# Patient Record
Sex: Male | Born: 1965 | Race: White | Hispanic: No | Marital: Married | Smoking: Never smoker
Health system: Southern US, Community
[De-identification: ages and names within clinical notes are randomized; demographics above are authoritative.]

## PROBLEM LIST (undated history)

## (undated) DIAGNOSIS — E669 Obesity, unspecified: Secondary | ICD-10-CM

## (undated) DIAGNOSIS — K649 Unspecified hemorrhoids: Secondary | ICD-10-CM

## (undated) DIAGNOSIS — R9431 Abnormal electrocardiogram [ECG] [EKG]: Secondary | ICD-10-CM

## (undated) DIAGNOSIS — I10 Essential (primary) hypertension: Secondary | ICD-10-CM

## (undated) DIAGNOSIS — R011 Cardiac murmur, unspecified: Secondary | ICD-10-CM

## (undated) DIAGNOSIS — G5600 Carpal tunnel syndrome, unspecified upper limb: Secondary | ICD-10-CM

## (undated) DIAGNOSIS — F32A Depression, unspecified: Secondary | ICD-10-CM

## (undated) DIAGNOSIS — F329 Major depressive disorder, single episode, unspecified: Secondary | ICD-10-CM

## (undated) DIAGNOSIS — R079 Chest pain, unspecified: Secondary | ICD-10-CM

## (undated) HISTORY — DX: Major depressive disorder, single episode, unspecified: F32.9

## (undated) HISTORY — DX: Abnormal electrocardiogram (ECG) (EKG): R94.31

## (undated) HISTORY — DX: Obesity, unspecified: E66.9

## (undated) HISTORY — PX: MELANOMA EXCISION: SHX5266

## (undated) HISTORY — DX: Carpal tunnel syndrome, unspecified upper limb: G56.00

## (undated) HISTORY — DX: Unspecified hemorrhoids: K64.9

## (undated) HISTORY — DX: Essential (primary) hypertension: I10

## (undated) HISTORY — DX: Depression, unspecified: F32.A

## (undated) HISTORY — DX: Chest pain, unspecified: R07.9

---

## 1998-07-18 ENCOUNTER — Ambulatory Visit: Admission: RE | Admit: 1998-07-18 | Discharge: 1998-07-18 | Payer: Self-pay | Admitting: Internal Medicine

## 2013-07-18 ENCOUNTER — Emergency Department (HOSPITAL_COMMUNITY): Payer: BC Managed Care – PPO

## 2013-07-18 ENCOUNTER — Encounter (HOSPITAL_COMMUNITY): Payer: Self-pay | Admitting: Emergency Medicine

## 2013-07-18 ENCOUNTER — Observation Stay (HOSPITAL_COMMUNITY)
Admission: EM | Admit: 2013-07-18 | Discharge: 2013-07-18 | Disposition: A | Discharge status: Home / Self Care | Payer: BC Managed Care – PPO | Attending: Internal Medicine | Admitting: Internal Medicine

## 2013-07-18 ENCOUNTER — Other Ambulatory Visit: Payer: Self-pay

## 2013-07-18 DIAGNOSIS — I10 Essential (primary) hypertension: Secondary | ICD-10-CM

## 2013-07-18 DIAGNOSIS — R011 Cardiac murmur, unspecified: Secondary | ICD-10-CM | POA: Diagnosis present

## 2013-07-18 DIAGNOSIS — R079 Chest pain, unspecified: Principal | ICD-10-CM | POA: Diagnosis present

## 2013-07-18 DIAGNOSIS — R9431 Abnormal electrocardiogram [ECG] [EKG]: Secondary | ICD-10-CM | POA: Diagnosis present

## 2013-07-18 HISTORY — DX: Cardiac murmur, unspecified: R01.1

## 2013-07-18 LAB — HEPATIC FUNCTION PANEL
ALT: 26 U/L (ref 0–53)
Alkaline Phosphatase: 48 U/L (ref 39–117)
Bilirubin, Direct: <0.1 mg/dL (ref 0.0–0.3)
Total Protein: 7.4 g/dL (ref 6.0–8.3)

## 2013-07-18 LAB — TSH: TSH: 1.441 u[IU]/mL (ref 0.350–4.500)

## 2013-07-18 LAB — CBC
MCH: 28.8 pg (ref 26.0–34.0)
MCHC: 33.7 g/dL (ref 30.0–36.0)
RDW: 13.2 % (ref 11.5–15.5)

## 2013-07-18 LAB — POCT I-STAT TROPONIN I: Troponin i, poc: 0 ng/mL (ref 0.00–0.08)

## 2013-07-18 LAB — BASIC METABOLIC PANEL
BUN: 12 mg/dL (ref 6–23)
Calcium: 9.2 mg/dL (ref 8.4–10.5)
GFR calc Af Amer: >90 mL/min (ref >90)
GFR calc non Af Amer: 86 mL/min — ABNORMAL LOW (ref >90)
Glucose, Bld: 108 mg/dL — ABNORMAL HIGH (ref 70–99)
Potassium: 4.6 mEq/L (ref 3.5–5.1)
Sodium: 139 mEq/L (ref 135–145)

## 2013-07-18 LAB — HEMOGLOBIN A1C: Mean Plasma Glucose: 114 mg/dL (ref <117)

## 2013-07-18 LAB — RAPID URINE DRUG SCREEN, HOSP PERFORMED
Barbiturates: NOT DETECTED
Cocaine: NOT DETECTED
Tetrahydrocannabinol: NOT DETECTED

## 2013-07-18 LAB — LIPID PANEL
Cholesterol: 128 mg/dL (ref 0–200)
Total CHOL/HDL Ratio: 3 RATIO
Triglycerides: 45 mg/dL (ref <150)
VLDL: 9 mg/dL (ref 0–40)

## 2013-07-18 LAB — CK TOTAL AND CKMB (NOT AT ARMC): Total CK: 71 U/L (ref 7–232)

## 2013-07-18 LAB — MRSA PCR SCREENING: MRSA by PCR: NEGATIVE

## 2013-07-18 MED ORDER — ASPIRIN EC 81 MG PO TBEC
81.0000 mg | DELAYED_RELEASE_TABLET | Freq: Every day | ORAL | Status: DC
  Administered 2013-07-18: 81 mg via ORAL
  Filled 2013-07-18: qty 1

## 2013-07-18 MED ORDER — NITROGLYCERIN 0.4 MG SL SUBL: 0.4000 mg | SUBLINGUAL_TABLET | SUBLINGUAL | Status: DC | PRN

## 2013-07-18 MED ORDER — ENOXAPARIN SODIUM 40 MG/0.4ML ~~LOC~~ SOLN
40.0000 mg | SUBCUTANEOUS | Status: DC
  Administered 2013-07-18: 40 mg via SUBCUTANEOUS
  Filled 2013-07-18: qty 0.4

## 2013-07-18 MED ORDER — ACETAMINOPHEN 325 MG PO TABS: 650.0000 mg | ORAL_TABLET | ORAL | Status: DC | PRN

## 2013-07-18 MED ORDER — NITROGLYCERIN 2 % TD OINT
1.0000 [in_us] | TOPICAL_OINTMENT | Freq: Once | TRANSDERMAL | Status: AC
  Administered 2013-07-18: 1 [in_us] via TOPICAL
  Filled 2013-07-18: qty 1

## 2013-07-18 MED ORDER — ONDANSETRON HCL 4 MG/2ML IJ SOLN: 4.0000 mg | Freq: Four times a day (QID) | INTRAMUSCULAR | Status: DC | PRN

## 2013-07-18 MED ORDER — NITROGLYCERIN 2 % TD OINT
0.5000 [in_us] | TOPICAL_OINTMENT | Freq: Four times a day (QID) | TRANSDERMAL | Status: DC
Start: 2013-07-18 — End: 2013-07-18
  Administered 2013-07-18 (×2): 0.5 [in_us] via TOPICAL
  Filled 2013-07-18: qty 30

## 2013-07-18 MED ORDER — ONDANSETRON HCL 4 MG/2ML IJ SOLN
4.0000 mg | Freq: Once | INTRAMUSCULAR | Status: AC
  Administered 2013-07-18: 4 mg via INTRAVENOUS
  Filled 2013-07-18: qty 2

## 2013-07-18 MED ORDER — PANTOPRAZOLE SODIUM 40 MG PO TBEC
40.0000 mg | DELAYED_RELEASE_TABLET | Freq: Every day | ORAL | Status: DC
  Administered 2013-07-18: 40 mg via ORAL
  Filled 2013-07-18: qty 1

## 2013-07-18 MED ORDER — MORPHINE SULFATE 2 MG/ML IJ SOLN: 1.0000 mg | INTRAMUSCULAR | Status: DC | PRN

## 2013-07-18 MED ORDER — GI COCKTAIL ~~LOC~~
30.0000 mL | Freq: Four times a day (QID) | ORAL | Status: DC | PRN
  Administered 2013-07-18: 30 mL via ORAL
  Filled 2013-07-18: qty 30

## 2013-07-18 MED ORDER — MORPHINE SULFATE 4 MG/ML IJ SOLN
4.0000 mg | Freq: Once | INTRAMUSCULAR | Status: AC
  Administered 2013-07-18: 4 mg via INTRAVENOUS
  Filled 2013-07-18: qty 1

## 2013-07-18 NOTE — Progress Notes (Signed)
UR Completed.  Haron Beilke Jane [phone] 07/18/2013  

## 2013-07-18 NOTE — H&P (Signed)
[facility] History and Physical  Patient: Jerry Parrish  ZOX:[id number]  DOB: [birth year]  DOA: 07/18/2013  Referring physician: Dr Arnoldo Morale PCP: No PCP Per Patient  Consults:   none  Chief Complaint: Chest pain  HPI: Jerry Parrish is a 47 y.o. male with Past medical history of cardiac murmur. He presented today with the complaint of chest pain that has been ongoing since last one week. He had 3 episodes of chest pain almost all of them occurred the night. The pain is located in the lower chest area feels like pressure, does not get worse with movement or breathing or cough. Is not associated with nausea vomiting diaphoresis or shortness of breath. Does not radiate. Tonight the pain was significantly severe and still continues with onset around 10:30 PM. Patient denies any complaint of fever, chills, cough, palpitation, dizziness, lightheadedness, syncopal episode, trauma. He denies any active smoking. He denies any premature cardiac disease in the family. He has felt some heartburn today but otherwise denies any history of GERD.   Review of Systems: as mentioned in the history of present illness.  A Comprehensive review of the other systems is negative.  Past Medical History  Diagnosis Date  . Heart murmur    History reviewed. No pertinent past surgical history. Social History:  reports that he has never smoked. He does not have any smokeless tobacco history on file. He reports that  drinks alcohol. He reports that he does not use illicit drugs. Patient is coming from home. Independent for most of his  ADL.  No Known Allergies  No family history on file.  Prior to Admission medications   Medication Sig Start Date End Date Taking? Authorizing Provider  aspirin 325 MG tablet Take 650 mg by mouth daily.   Yes Historical Provider, MD  Calcium Carbonate Antacid (TUMS PO) Take 2 tablets by mouth daily as needed (upset stomach, acid reflux).   Yes Historical Provider, MD     Physical Exam: Filed Vitals:   07/18/13 0044 07/18/13 0145 07/18/13 0400  BP: 159/89 142/74 138/83  Pulse: 67 56 56  Temp: 98.1 F (36.7 C)  98.1 F (36.7 C)  TempSrc: Oral  Oral  Resp: 16 16 14   Height:   5\' 10"  (1.778 m)  Weight:   121.4 kg (267 lb 10.2 oz)  SpO2: 98% 99% 98%    General: Alert, Awake and Oriented to Time, Place and Person. Appear in mild distress Eyes: PERRL ENT: Oral Mucosa clear moist. Neck: no JVD, no Carotid Bruits  Cardiovascular: S1 and S2 Present, aortic systolic grade 4/6 Murmur, Peripheral Pulses Present Respiratory: Bilateral Air entry equal and Decreased, Clear to Auscultation,  no Crackles,no wheezes Abdomen: Bowel Sound Present, Soft and Non tender Skin: no Rash Extremities: no Pedal edema, no calf tenderness Neurologic: Grossly Unremarkable.  Labs on Admission:  CBC:  Recent Labs Lab 07/18/13 0056  WBC 7.4  HGB 14.3  HCT 42.4  MCV 85.3  PLT 272    CMP     Component Value Date/Time   NA 139 07/18/2013 0056   K 4.6 07/18/2013 0056   CL 103 07/18/2013 0056   CO2 27 07/18/2013 0056   GLUCOSE 108* 07/18/2013 0056   BUN 12 07/18/2013 0056   CREATININE 1.02 07/18/2013 0056   CALCIUM 9.2 07/18/2013 0056   PROT 7.4 07/18/2013 0056   ALBUMIN 3.8 07/18/2013 0056   AST 18 07/18/2013 0056   ALT 26 07/18/2013 0056   ALKPHOS 48 07/18/2013 0056  BILITOT 0.2* 07/18/2013 0056   GFRNONAA 86* 07/18/2013 0056   GFRAA >90 07/18/2013 0056     Recent Labs Lab 07/18/13 0056  LIPASE 31   No results found for this basename: AMMONIA,  in the last 168 hours  Cardiac Enzymes: No results found for this basename: CKTOTAL, CKMB, CKMBINDEX, TROPONINI,  in the last 168 hours  BNP (last 3 results)  Recent Labs  07/18/13 0056  PROBNP 45.5    Radiological Exams on Admission: Dg Chest 2 View  07/18/2013   CLINICAL DATA:  Chest pain.  EXAM: CHEST  2 VIEW  COMPARISON:  None.  FINDINGS: Mild cardiomegaly. Low lung volumes. No confluent opacities,  effusions or edema. No acute bony abnormality.  IMPRESSION: No active cardiopulmonary disease.   Electronically Signed   By: Charlett Nose M.D.   On: 07/18/2013 01:05    EKG: Independently reviewed. T wave inversions in lead 3, nonspecific ST-T segment changes in other leads. No signs of acute ischemia  Assessment/Plan Principal Problem:   Chest pain Active Problems:   T wave inversion in EKG   Heart murmur   1. Chest pain The patient does have typical chest pain occurring at rest and he has history of aortic murmur and also he has T wave inversions but not specific ST-T wave changes. He has negative troponin and has no significant risk factors other than aspirin use on and off. He continues to have chest pain at present. Considering the combination of T wave inversions, continuous chest pain with progressive worsening over last few days patient will be admitted for observation under telemetry to rule out ACS. Serial telemetry, serial troponin,An echocardiogram in the morning .  pain control with nitroglycerin paste and when necessary IV morphine. protonix will be added for gerd symptoms.  DVT Prophylaxis: subcutaneous Heparin Nutrition: Cardiac diet   Code Status: Full   Family Communication: Wife  was present at bedside, opportunity was given to the family to ask question and all questions were answered satisfactorily at the time of interview. Disposition: Admitted to observation step-down unit  Author: Lynden Oxford, MD [facility] Pager: [phone] 07/18/2013, 4:15 AM    If 7PM-7AM, please contact night-coverage [email or web address] Password [email or web address]

## 2013-07-18 NOTE — ED Notes (Signed)
Pt states that he has been having chest pain since 1030pm. He states that he took Tums to try to relieve the chest pain.

## 2013-07-18 NOTE — ED Provider Notes (Signed)
CSN: [id number]     Arrival date & time 07/18/13  0034 History   First MD Initiated Contact with Patient 07/18/13 0107     Chief Complaint  Patient presents with  . Chest Pain   (Consider location/radiation/quality/duration/timing/severity/associated sxs/prior Treatment) HPI This patient is a 47 yo man who presents with complaints of intermittent, centrally located chest pressure/discomfort for the past 2 weeks. Sx seem to come on spontaneously but only in the evenings. Patient notes some epigastric discomfort as well. Occasional nausea but, no vomiting.  Sx have been mild and self limited until this evening.   Tonight, he comes in because his pain has been more persistent and severe. Pain began around 2230 while patient was watching TV. Pain has waxed and waned and is currently mild. Patient endorses some associated diaphoresis and sense of "maybe" shortness of breath. No cough.   Patient notes that sx always seem to occur at night around 2200h to 2300h. He has not noticed a relationship to type or quantity of food intake. No history of abdominal surgeries.   Patient notes that he was diagnosed with a heart murmur as a teenager and thus is potentially anxious about the discomfort he has experienced over the past couple of weeks.   Patient has no CAD RF and has never had a cardiac work up. He says he drinks alcohol socially. No history of pancreatitis, PUD, GERD or GB disease.   At the time of initial exam, patient rated pain 7/10.   Past Medical History  Diagnosis Date  . Heart murmur    History reviewed. No pertinent past surgical history. No family history on file. History  Substance Use Topics  . Smoking status: Never Smoker   . Smokeless tobacco: Not on file  . Alcohol Use: Yes    Review of Systems 10 point ROS obtained and is negative except as noted above.   Allergies  Review of patient's allergies indicates not on file.  Home Medications  No current outpatient  prescriptions on file. BP 159/89  Pulse 67  Temp(Src) 98.1 F (36.7 C) (Oral)  Resp 16  SpO2 98% Physical Exam Gen: well developed and well nourished appearing. appears mildly uncomfortable Head: NCAT Eyes: PERL, EOMI Nose: no epistaixis or rhinorrhea Mouth/throat: mucosa is moist and pink Neck: supple, no stridor Lungs: CTA B, no wheezing, rhonchi or rales CV: RRR, no murmur Abd: soft,mildly tender over the midline epigastrium, nondistended Back: no ttp, no cva ttp Skin: warm and dry Ext: no edema.  Neuro: CN ii-xii grossly intact, no focal deficits Psyche; normal affect,  calm and cooperative.   ED Course  Procedures (including critical care time)  EKG: nsr, no acute ischemic changes, normal intervals, normal axis, normal qrs complex, t wave inversions in inferior leads. No previous EKGs for comparison.   Dg Chest 2 View  07/18/2013   CLINICAL DATA:  Chest pain.  EXAM: CHEST  2 VIEW  COMPARISON:  None.  FINDINGS: Mild cardiomegaly. Low lung volumes. No confluent opacities, effusions or edema. No acute bony abnormality.  IMPRESSION: No active cardiopulmonary disease.   Electronically Signed   By: Charlett Nose M.D.   On: 07/18/2013 01:05   Results for orders placed during the hospital encounter of 07/18/13 (from the past 24 hour(s))  CBC     Status: None   Collection Time    07/18/13 12:56 AM      Result Value Range   WBC 7.4  4.0 - 10.5 K/uL   RBC  4.97  4.22 - 5.81 MIL/uL   Hemoglobin 14.3  13.0 - 17.0 g/dL   HCT 16.1  09.6 - 04.5 %   MCV 85.3  78.0 - 100.0 fL   MCH 28.8  26.0 - 34.0 pg   MCHC 33.7  30.0 - 36.0 g/dL   RDW 40.9  81.1 - 91.4 %   Platelets 272  150 - 400 K/uL  BASIC METABOLIC PANEL     Status: Abnormal   Collection Time    07/18/13 12:56 AM      Result Value Range   Sodium 139  135 - 145 mEq/L   Potassium 4.6  3.5 - 5.1 mEq/L   Chloride 103  96 - 112 mEq/L   CO2 27  19 - 32 mEq/L   Glucose, Bld 108 (*) 70 - 99 mg/dL   BUN 12  6 - 23 mg/dL    Creatinine, Ser 7.82  0.50 - 1.35 mg/dL   Calcium 9.2  8.4 - 95.6 mg/dL   GFR calc non Af Amer 86 (*) >90 mL/min   GFR calc Af Amer >90  >90 mL/min  PRO B NATRIURETIC PEPTIDE     Status: None   Collection Time    07/18/13 12:56 AM      Result Value Range   Pro B Natriuretic peptide (BNP) 45.5  0 - 125 pg/mL  LIPASE, BLOOD     Status: None   Collection Time    07/18/13 12:56 AM      Result Value Range   Lipase 31  11 - 59 U/L  HEPATIC FUNCTION PANEL     Status: Abnormal (Preliminary result)   Collection Time    07/18/13 12:56 AM      Result Value Range   Total Protein 7.4  6.0 - 8.3 g/dL   Albumin 3.8  3.5 - 5.2 g/dL   AST 18  0 - 37 U/L   ALT 26  0 - 53 U/L   Alkaline Phosphatase 48  39 - 117 U/L   Total Bilirubin 0.2 (*) 0.3 - 1.2 mg/dL   Bilirubin, Direct PENDING  0.0 - 0.3 mg/dL   Indirect Bilirubin PENDING  0.3 - 0.9 mg/dL  POCT I-STAT TROPONIN I     Status: None   Collection Time    07/18/13  1:34 AM      Result Value Range   Troponin i, poc 0.00  0.00 - 0.08 ng/mL   Comment 3              MDM   ED work up is non-diagnostic but concerning in light the patient symptoms, HTN  In ED and non specific EKG abnormalities without benefit of comparison.   The patient had ASA PTA. We are treating with MS, NTG.   I have discussed the case with Dr. Allena Katz. He has accepted the patient to the SDU for further evaluation of chest pain.    Brandt Loosen, MD 07/18/13 [phone]

## 2013-07-18 NOTE — ED Notes (Signed)
Pt. reports generalized chest pain ( pressure/tightness) onset this evening with slight SOB and nausea , pt. took 2 regular ASA and an antacid prior to arrival with no relief.

## 2013-07-18 NOTE — Progress Notes (Signed)
Pt discharged to home.  MI r/o, however, pt has discharge instructions to follow-up with a cardiologist and obtain an updated echo.  Pt stated that he feels this must be related to his stomach since this type of pain occurs often after he eats and lies down.  All VS were WNL while on the unit with no arrythmias - SB to SR and no c/o chest pain.  IV was d/c'd and nitroglycerin patch was removed prior to discharge.  Discharge instructions were reviewed with the pt and the pt's wife.  The PT's VS were all WNL upon discharge via wheelchair.  Pt was taken to the main exit and assisted into wife's car.

## 2013-07-18 NOTE — Plan of Care (Signed)
Problem: Phase I Progression Outcomes Goal: Anginal pain relieved Outcome: Not Progressing Pt remains with chest pain unchanged

## 2013-07-19 NOTE — Discharge Summary (Signed)
Physician Discharge Summary  Jerry Parrish ZHY:[id number] DOB: 1966-04-09 DOA: 07/18/2013  PCP: [facility]  Admit date: 07/18/2013 Discharge date: 07/19/2013  Time spent: >45 minutes  Discharge Diagnoses:  Principal Problem:   Chest pain Active Problems:   T wave inversion in EKG   Heart murmur   Discharge Condition: stable  Diet recommendation: avoid caffeine, oily and spicy food- heart healthy diet.   Filed Weights   07/18/13 0400  Weight: 121.4 kg (267 lb 10.2 oz)    History of present illness:  Brief narrative: Jerry Parrish is a 47 y.o. male with Past medical history of cardiac murmur.Per H and P pt had chest pain and has had it multiple times always occurring in the evening. He states that on the evening of admission the pain was severe. It continued in the ER. He was admitted to r/o ACS  Assessment/Plan: Principal Problem:   Chest pain - cardiac enzymes negative- only non-specific T wave inversion in lead 3.  - when I examined him he uses his hands to describe that pain was across the upper abdomen and radiated to the mid abdomen. He is currently not having it. He was also taking a full dose ASA when he was getting the pain. I have advised him to stop this as well.  I suspect his pain is GI related and for now have asked he start BID Pepcid and take it for at least 1 wk. If improvement does not occur, recommend he f/u with his PCP. He is agreeable with this plan.   Discharge Exam: Filed Vitals:   07/18/13 1400  BP: 100/51  Pulse: 71  Temp:   Resp: 18    General: AAO x 3, no distress Cardiovascular: RRR Respiratory: CTA b/l  Abdomen: soft, NT, ND, BS+  Discharge Instructions  Discharge Orders   Future Orders Complete By Expires   Diet - low sodium heart healthy  As directed    Diet - low sodium heart healthy  As directed    Discharge instructions  As directed    Comments:     Start Pepcid 20 mg twice a day and take for at least 1 wk. Avoid  Caffeine, oily, spicy food and Aspirin, Ibuprofen, Naprosyn.   Increase activity slowly  As directed    Increase activity slowly  As directed        Medication List    STOP taking these medications       aspirin 325 MG tablet      TAKE these medications       TUMS PO  Take 2 tablets by mouth daily as needed (upset stomach, acid reflux).       No Known Allergies    The results of significant diagnostics from this hospitalization (including imaging, microbiology, ancillary and laboratory) are listed below for reference.    Significant Diagnostic Studies: Dg Chest 2 View  07/18/2013   CLINICAL DATA:  Chest pain.  EXAM: CHEST  2 VIEW  COMPARISON:  None.  FINDINGS: Mild cardiomegaly. Low lung volumes. No confluent opacities, effusions or edema. No acute bony abnormality.  IMPRESSION: No active cardiopulmonary disease.   Electronically Signed   By: Charlett Nose M.D.   On: 07/18/2013 01:05    Microbiology: Recent Results (from the past 240 hour(s))  MRSA PCR SCREENING     Status: None   Collection Time    07/18/13  5:00 AM      Result Value Range Status   MRSA by  PCR NEGATIVE  NEGATIVE Final   Comment:            The GeneXpert MRSA Assay (FDA     approved for NASAL specimens     only), is one component of a     comprehensive MRSA colonization     surveillance program. It is not     intended to diagnose MRSA     infection nor to guide or     monitor treatment for     MRSA infections.     Labs: Basic Metabolic Panel:  Recent Labs Lab 07/18/13 0056  NA 139  K 4.6  CL 103  CO2 27  GLUCOSE 108*  BUN 12  CREATININE 1.02  CALCIUM 9.2   Liver Function Tests:  Recent Labs Lab 07/18/13 0056  AST 18  ALT 26  ALKPHOS 48  BILITOT 0.2*  PROT 7.4  ALBUMIN 3.8    Recent Labs Lab 07/18/13 0056  LIPASE 31   No results found for this basename: AMMONIA,  in the last 168 hours CBC:  Recent Labs Lab 07/18/13 0056  WBC 7.4  HGB 14.3  HCT 42.4  MCV 85.3   PLT 272   Cardiac Enzymes:  Recent Labs Lab 07/18/13 0530 07/18/13 0750 07/18/13 1240  CKTOTAL  --  71  --   CKMB  --  2.2  --   TROPONINI <0.30  --  <0.30   BNP: BNP (last 3 results)  Recent Labs  07/18/13 0056  PROBNP 45.5   CBG: No results found for this basename: GLUCAP,  in the last 168 hours     Signed:  Kristianna Saperstein  [facility] 07/19/2013, 1:13 PM

## 2013-08-06 ENCOUNTER — Ambulatory Visit (HOSPITAL_COMMUNITY): Payer: BC Managed Care – PPO | Attending: Cardiovascular Disease | Admitting: Radiology

## 2013-08-06 ENCOUNTER — Other Ambulatory Visit (HOSPITAL_COMMUNITY): Payer: Self-pay | Admitting: Interventional Cardiology

## 2013-08-06 DIAGNOSIS — R072 Precordial pain: Secondary | ICD-10-CM

## 2013-08-06 DIAGNOSIS — R079 Chest pain, unspecified: Secondary | ICD-10-CM

## 2013-08-06 DIAGNOSIS — I379 Nonrheumatic pulmonary valve disorder, unspecified: Secondary | ICD-10-CM | POA: Insufficient documentation

## 2013-08-06 DIAGNOSIS — I079 Rheumatic tricuspid valve disease, unspecified: Secondary | ICD-10-CM | POA: Insufficient documentation

## 2013-08-06 DIAGNOSIS — R011 Cardiac murmur, unspecified: Secondary | ICD-10-CM | POA: Insufficient documentation

## 2013-08-06 DIAGNOSIS — I080 Rheumatic disorders of both mitral and aortic valves: Secondary | ICD-10-CM | POA: Insufficient documentation

## 2013-08-06 NOTE — Progress Notes (Signed)
Echocardiogram performed.  

## 2013-09-06 ENCOUNTER — Encounter: Payer: Self-pay | Admitting: Interventional Cardiology

## 2013-09-06 ENCOUNTER — Encounter: Payer: Self-pay | Admitting: *Deleted

## 2013-09-06 DIAGNOSIS — I10 Essential (primary) hypertension: Secondary | ICD-10-CM | POA: Insufficient documentation

## 2013-09-07 ENCOUNTER — Ambulatory Visit (INDEPENDENT_AMBULATORY_CARE_PROVIDER_SITE_OTHER): Payer: BC Managed Care – PPO | Admitting: Cardiology

## 2013-09-07 ENCOUNTER — Encounter (INDEPENDENT_AMBULATORY_CARE_PROVIDER_SITE_OTHER): Payer: BC Managed Care – PPO | Admitting: Interventional Cardiology

## 2013-09-07 ENCOUNTER — Encounter (INDEPENDENT_AMBULATORY_CARE_PROVIDER_SITE_OTHER): Payer: Self-pay

## 2013-09-07 ENCOUNTER — Other Ambulatory Visit: Payer: Self-pay | Admitting: Interventional Cardiology

## 2013-09-07 DIAGNOSIS — R079 Chest pain, unspecified: Secondary | ICD-10-CM

## 2013-09-07 NOTE — Progress Notes (Signed)
Exercise Treadmill Test  Pre-Exercise Testing Evaluation Rhythm: normal sinus  Rate: 88     Test  Exercise Tolerance Test Ordering MD: Armanda Magic, MD  Interpreting MD: Armanda Magic, MD  Unique Test No: 1  Treadmill:  1  Indication for ETT: chest pain - rule out ischemia  Contraindication to ETT: No   Stress Modality: exercise - treadmill  Cardiac Imaging Performed: non   Protocol: standard Bruce - maximal  Max BP:  162/86 mmHg  Max MPHR (bpm):  174 85% MPR (bpm):  148  MPHR obtained (bpm):  150bpm % MPHR obtained:  87%  Reached 85% MPHR (min:sec):  8 minutes 6 seconds Total Exercise Time (min-sec):  8 minutes 6 seconds  Workload in METS:  10.1 Borg Scale: 17  Reason ETT Terminated:  patient's desire to stop    ST Segment Analysis At Rest: normal ST segments - no evidence of significant ST depression With Exercise: no evidence of significant ST depression  Other Information Arrhythmia:  Yes Angina during ETT:  absent (0) Quality of ETT:  diagnostic  ETT Interpretation:  normal - no evidence of ischemia by ST analysis  Comments: Normal ETT with no inducible ischemia by EKG Trigeminal PVC's Symptoms of SOB during ETT  Recommendations: Continue current medical therapy

## 2015-07-12 IMAGING — CR DG CHEST 2V
2 series · 2 of 2 positions shown · non-contrast
Comparison: None.

CLINICAL DATA: Chest pain.

EXAM:
CHEST  2 VIEW

[w chest pa]
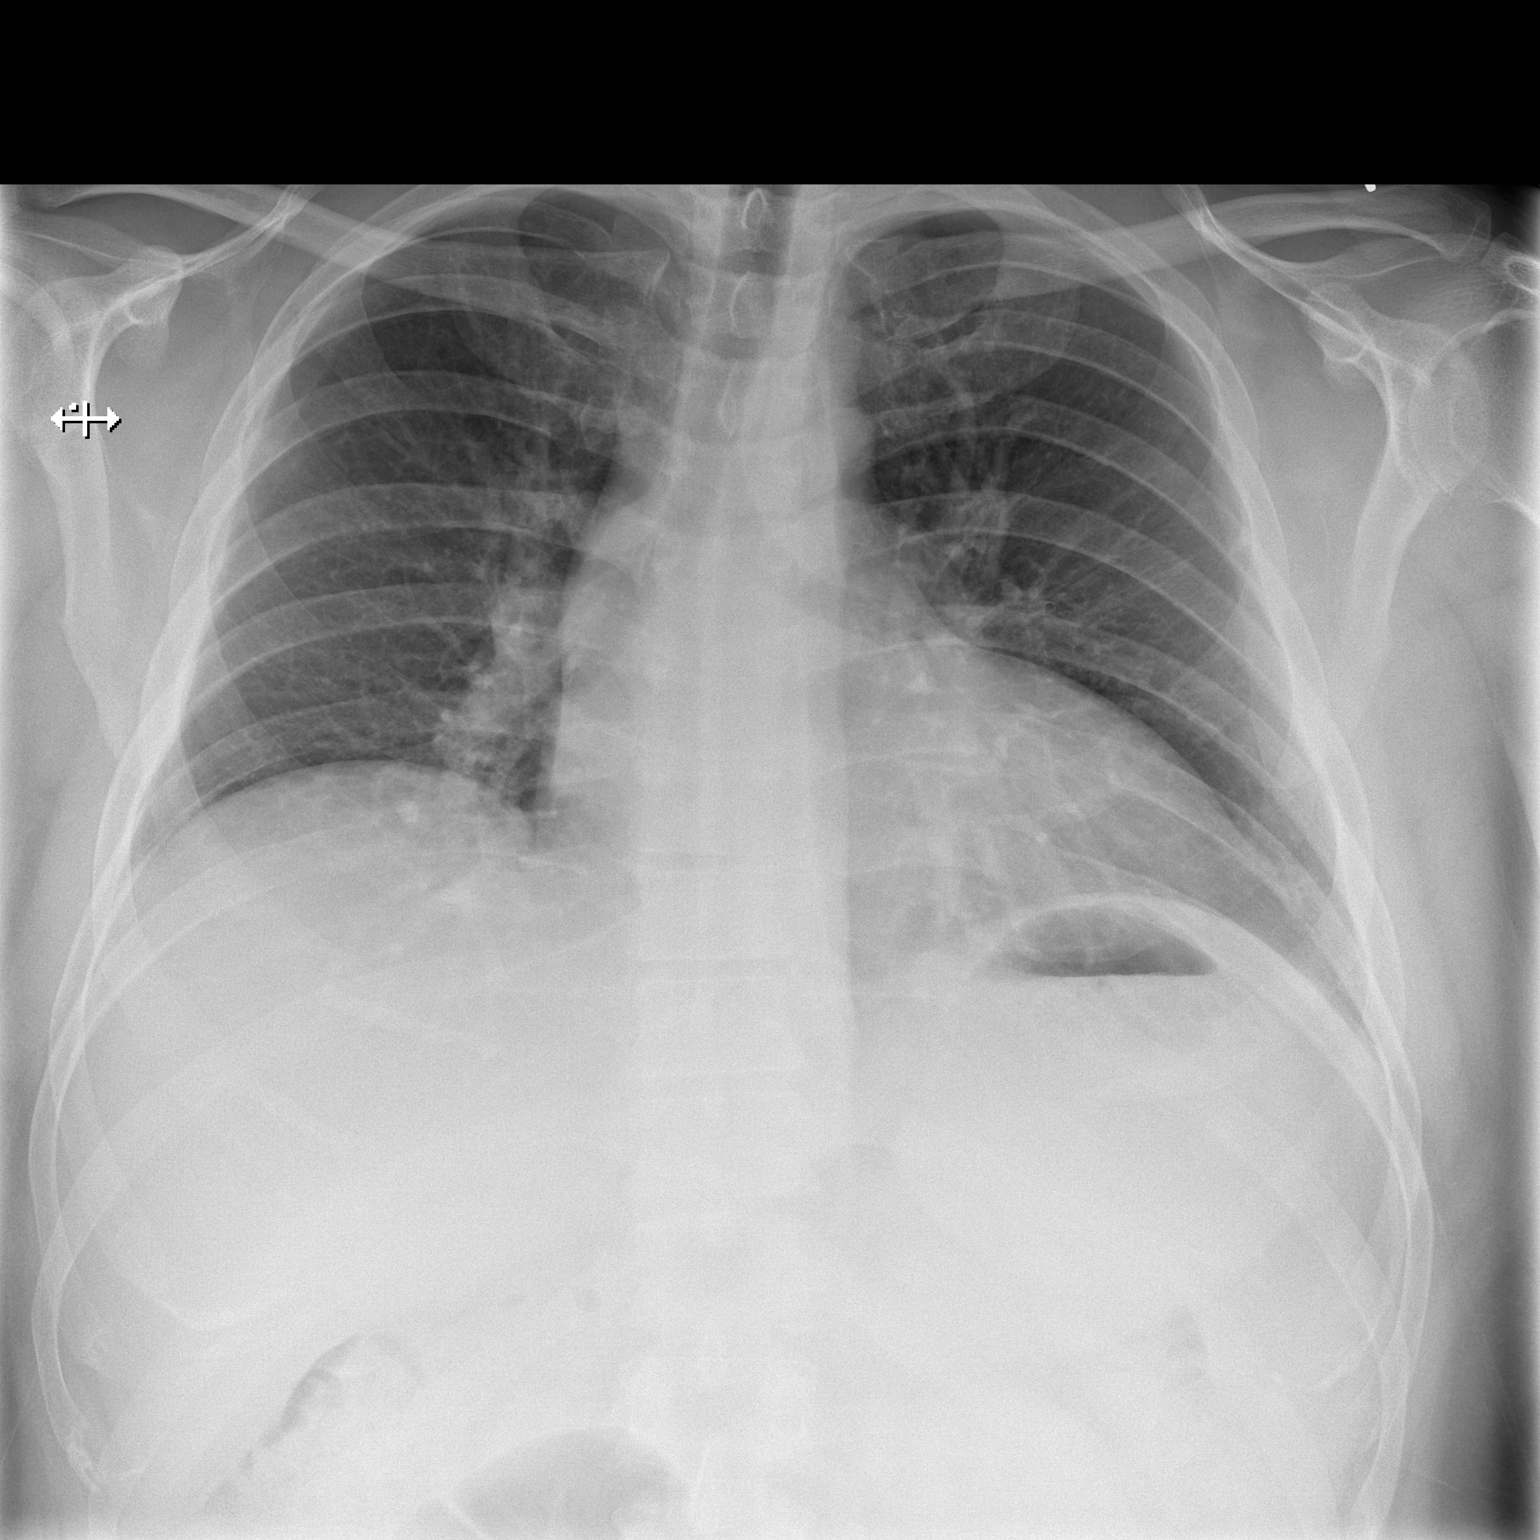

[w chest lat]
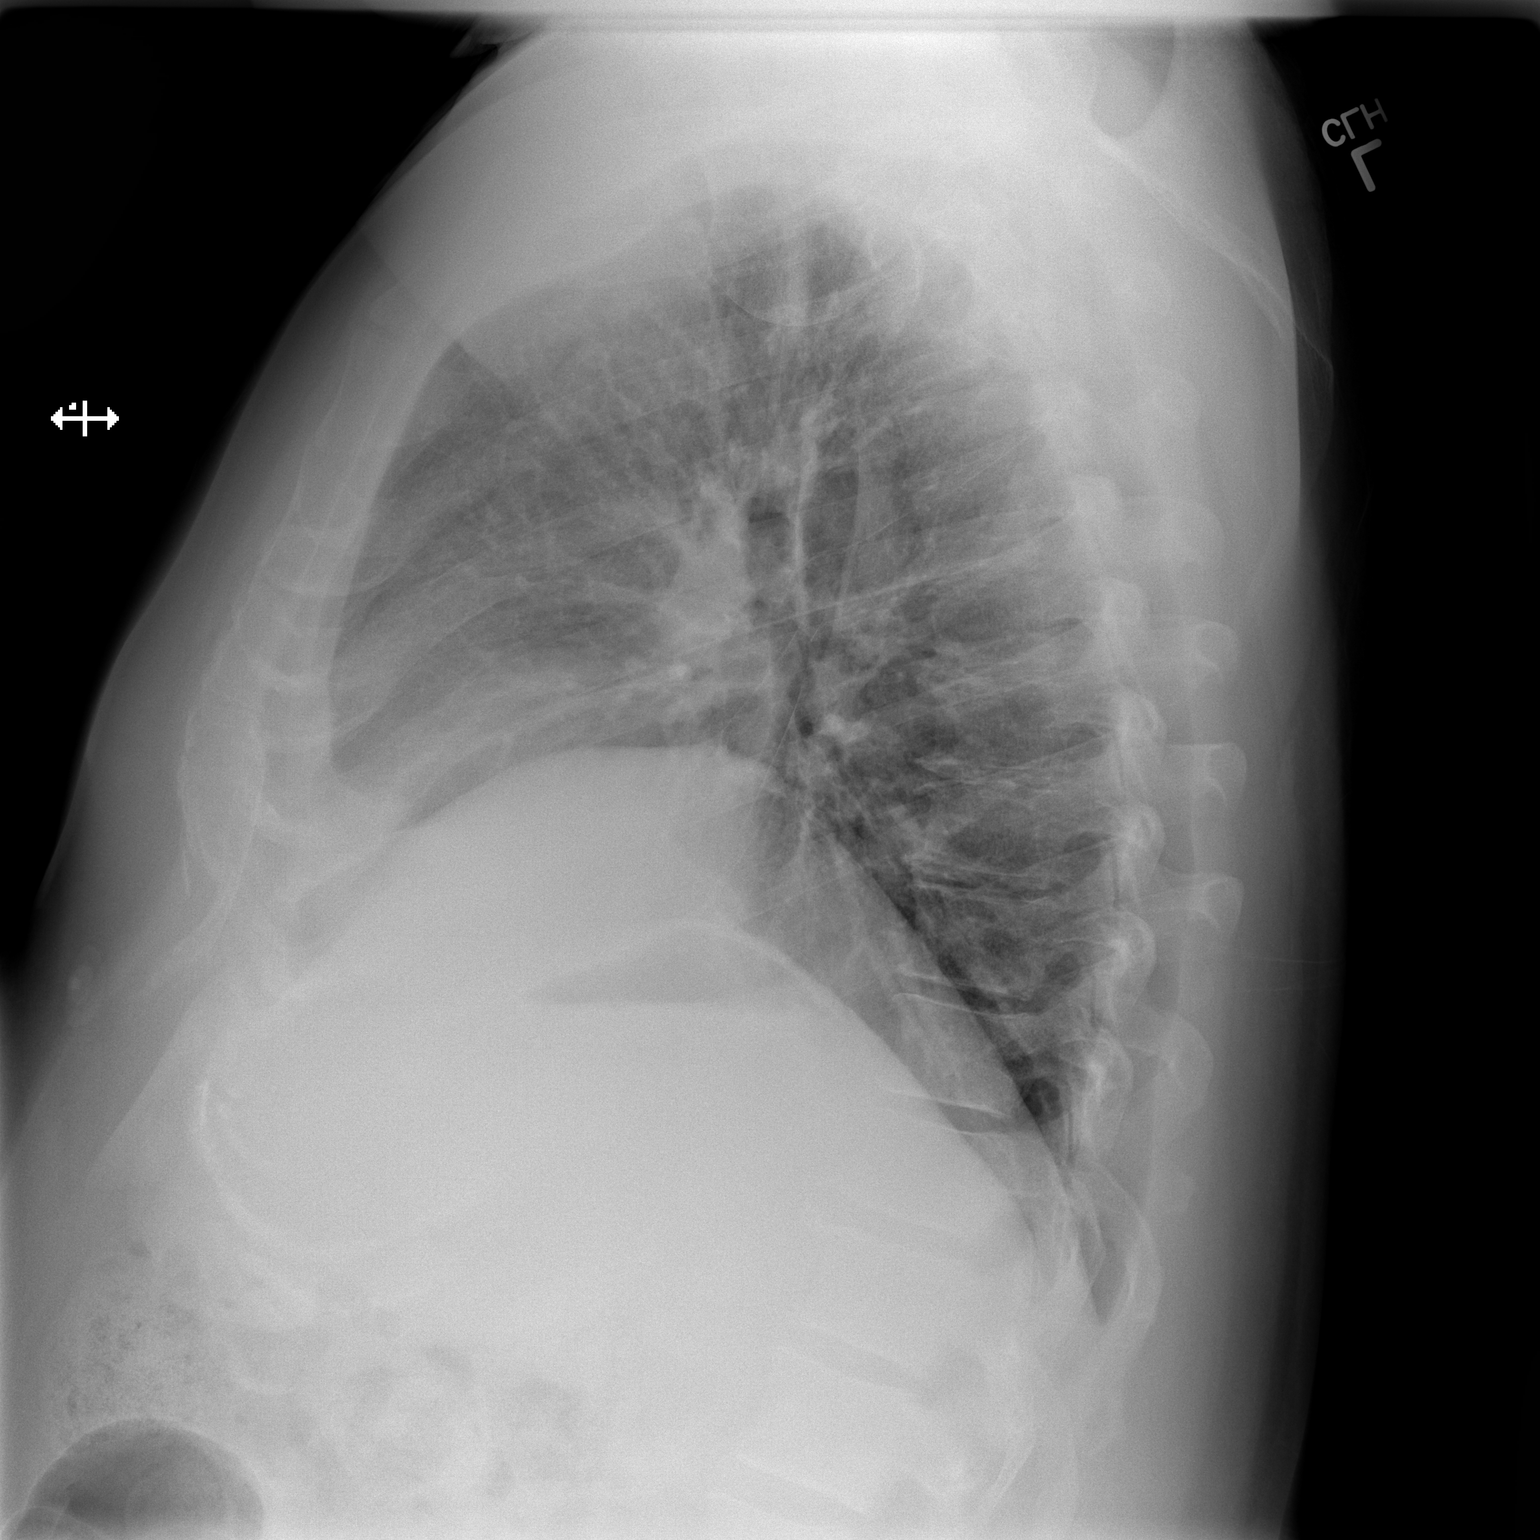

[2 of 2 positions shown; findings below may reference images not displayed]

FINDINGS: Mild cardiomegaly. Low lung volumes. No confluent opacities,
effusions or edema. No acute bony abnormality.
IMPRESSION: No active cardiopulmonary disease.

## 2019-12-30 ENCOUNTER — Ambulatory Visit: Payer: Self-pay | Attending: Internal Medicine

## 2019-12-30 DIAGNOSIS — Z23 Encounter for immunization: Secondary | ICD-10-CM | POA: Insufficient documentation

## 2019-12-30 NOTE — Progress Notes (Signed)
   Covid-19 Vaccination Clinic  Name:  MICAI APOLINAR    MRN: [id number] DOB: 1966/03/17  12/30/2019  [name] was observed post Covid-19 immunization for 15 minutes without incidence. He was provided with Vaccine Information Sheet and instruction to access the V-Safe system.   Mr. Luiz was instructed to call 911 with any severe reactions post vaccine: Marland Kitchen Difficulty breathing  . Swelling of your face and throat  . A fast heartbeat  . A bad rash all over your body  . Dizziness and weakness    Immunizations Administered    Name Date Dose VIS Date Route   Pfizer COVID-19 Vaccine 12/30/2019  9:58 AM 0.3 mL 10/15/2019 Intramuscular   Manufacturer: [employer], [employer]   Lot: [id number]   NDC: [address]-6

## 2020-01-19 ENCOUNTER — Ambulatory Visit: Payer: Self-pay | Attending: Internal Medicine

## 2020-01-19 DIAGNOSIS — Z23 Encounter for immunization: Secondary | ICD-10-CM

## 2020-01-19 NOTE — Progress Notes (Signed)
   Covid-19 Vaccination Clinic  Name:  Jerry Parrish    MRN: [id number] DOB: September 06, 1966  01/19/2020  [name] was observed post Covid-19 immunization for 15 minutes without incident. He was provided with Vaccine Information Sheet and instruction to access the V-Safe system.   [name] was instructed to call 911 with any severe reactions post vaccine: Marland Kitchen Difficulty breathing  . Swelling of face and throat  . A fast heartbeat  . A bad rash all over body  . Dizziness and weakness   Immunizations Administered    Name Date Dose VIS Date Route   Pfizer COVID-19 Vaccine 01/19/2020  4:19 PM 0.3 mL 10/15/2019 Intramuscular   Manufacturer: [employer], [employer]   Lot: [id number]   NDC: [address]-1

## 2024-05-31 ENCOUNTER — Encounter: Payer: Self-pay | Admitting: Cardiology

## 2024-05-31 ENCOUNTER — Ambulatory Visit: Attending: Cardiology | Admitting: Cardiology

## 2024-05-31 VITALS — BP 122/86 | HR 80 | Ht 70.5 in | Wt 296.0 lb

## 2024-05-31 DIAGNOSIS — I517 Cardiomegaly: Secondary | ICD-10-CM | POA: Diagnosis not present

## 2024-05-31 DIAGNOSIS — Q2381 Bicuspid aortic valve: Secondary | ICD-10-CM | POA: Diagnosis not present

## 2024-05-31 NOTE — Progress Notes (Signed)
 Cardiology Office Note:    Date:  05/31/2024   ID:  Jerry Parrish, DOB 1966/08/01, MRN [id number]  PCP:  Kip Righter, MD   [facility] Providers Cardiologist:  None     Referring MD: Kip Righter, MD   Chief Complaint  Patient presents with   Heart Murmur    Patient states that he has been out of breath lately. Patient states that he has some swelling around his ankle that he noticed on a camping trip. Meds reviewed.     History of Present Illness:    Jerry Parrish is a 58 y.o. male with a hx of bicuspid aortic valve, obesity, systolic murmur, GERD who presents due to history of bicuspid aortic valve and heart murmur.  He was diagnosed with a cardiac murmur in his 88s, and eventually bicuspid aortic valve over 15 years ago.  Was told he needed to follow-up with a cardiologist as he may need valve intervention.  Denies chest pain, BP adequately controlled.  Endorses leg edema which appears dependent such as when hiking.  Also has shortness of breath with exertion which she attributes to being overweight.  Denies palpitations, dizziness, syncope.  Past Medical History:  Diagnosis Date   Carpal tunnel syndrome    Chest pain    Depression    Heart murmur    Hemorrhoids    HTN (hypertension)    Obesity    T wave inversion in EKG     History reviewed. No pertinent surgical history.  Current Medications: Current Meds  Medication Sig   omeprazole (PRILOSEC) 20 MG capsule Take 20 mg by mouth daily. (Patient taking differently: Take 20 mg by mouth as needed.)   PARoxetine (PAXIL) 10 MG tablet Take 10 mg by mouth every morning.     Allergies:   Patient has no known allergies.   Social History   Socioeconomic History   Marital status: Married    Spouse name: Not on file   Number of children: Not on file   Years of education: Not on file   Highest education level: Not on file  Occupational History   Not on file  Tobacco Use   Smoking status: Never    Smokeless tobacco: Not on file  Substance and Sexual Activity   Alcohol use: Yes   Drug use: No   Sexual activity: Not on file  Other Topics Concern   Not on file  Social History Narrative   Not on file   Social Drivers of Health   Financial Resource Strain: Not on file  Food Insecurity: Not on file  Transportation Needs: Not on file  Physical Activity: Not on file  Stress: Not on file  Social Connections: Not on file     Family History: The patient's family history includes Diabetes Mellitus I in his father; Hypertension in his mother; Prostate cancer in his father.  ROS:   Please see the history of present illness.     All other systems reviewed and are negative.  EKGs/Labs/Other Studies Reviewed:    The following studies were reviewed today:  EKG Interpretation Date/Time:  Monday May 31 2024 09:24:18 EDT Ventricular Rate:  80 PR Interval:  154 QRS Duration:  126 QT Interval:  416 QTC Calculation: 479 R Axis:   -57  Text Interpretation: Normal sinus rhythm Left axis deviation Left ventricular hypertrophy with QRS widening and repolarization abnormality ( R in aVL , Cornell product ) Confirmed by Darliss Rogue ([id number]) on 05/31/2024 9:30:00 AM  Recent Labs: No results found for requested labs within last 365 days.  Recent Lipid Panel    Component Value Date/Time   CHOL 128 07/18/2013 0530   TRIG 45 07/18/2013 0530   HDL 42 07/18/2013 0530   CHOLHDL 3.0 07/18/2013 0530   VLDL 9 07/18/2013 0530   LDLCALC 77 07/18/2013 0530     Risk Assessment/Calculations:             Physical Exam:    VS:  BP 122/86   Pulse 80   Ht 5' 10.5 (1.791 m)   Wt 296 lb (134.3 kg)   SpO2 97%   BMI 41.87 kg/m     Wt Readings from Last 3 Encounters:  05/31/24 296 lb (134.3 kg)  07/18/13 267 lb 10.2 oz (121.4 kg)     GEN:  Well nourished, well developed in no acute distress HEENT: Normal NECK: No JVD CARDIAC: RRR, 3/6 systolic murmur loudest at right sternal  border. RESPIRATORY:  Clear to auscultation without rales, wheezing or rhonchi  ABDOMEN: Soft, non-tender, non-distended MUSCULOSKELETAL:  No edema; No deformity  SKIN: Warm and dry NEUROLOGIC:  Alert and oriented x 3 PSYCHIATRIC:  Normal affect   ASSESSMENT:    1. Bicuspid aortic valve   2. LVH (left ventricular hypertrophy)   3. Morbid obesity (HCC)    PLAN:    In order of problems listed above:  History of bicuspid aortic valve, systolic murmur on exam.  Obtain echo to evaluate aortic valve disease. LVH on EKG.  Obtain echo as above. Morbid obesity, low-calorie diet, weight loss advised.  Follow-up after echocardiogram.     Medication Adjustments/Labs and Tests Ordered: Current medicines are reviewed at length with the patient today.  Concerns regarding medicines are outlined above.  Orders Placed This Encounter  Procedures   EKG 12-Lead   ECHOCARDIOGRAM COMPLETE   No orders of the defined types were placed in this encounter.   Patient Instructions  Medication Instructions:  Your physician recommends that you continue on your current medications as directed. Please refer to the Current Medication list given to you today.   *If you need a refill on your cardiac medications before your next appointment, please call your pharmacy*  Lab Work: No labs ordered today  If you have labs (blood work) drawn today and your tests are completely normal, you will receive your results only by: MyChart Message (if you have MyChart) OR A paper copy in the mail If you have any lab test that is abnormal or we need to change your treatment, we will call you to review the results.  Testing/Procedures: Your physician has requested that you have an echocardiogram. Echocardiography is a painless test that uses sound waves to create images of your heart. It provides your doctor with information about the size and shape of your heart and how well your heart's chambers and valves are working.    You may receive an ultrasound enhancing agent through an IV if needed to better visualize your heart during the echo. This procedure takes approximately one hour.  There are no restrictions for this procedure.  This will take place at [address] [facility] [address]) [address], [place] [address]  Please note: We ask at that you not bring children with you during ultrasound (echo/ vascular) testing. Due to room size and safety concerns, children are not allowed in the ultrasound rooms during exams. Our front office staff cannot provide observation of children in our lobby area while testing is  being conducted. An adult accompanying a patient to their appointment will only be allowed in the ultrasound room at the discretion of the ultrasound technician under special circumstances. We apologize for any inconvenience.   Follow-Up: At [facility], you and your health needs are our priority.  As part of our continuing mission to provide you with exceptional heart care, our providers are all part of one team.  This team includes your primary Cardiologist (physician) and Advanced Practice Providers or APPs (Physician Assistants and Nurse Practitioners) who all work together to provide you with the care you need, when you need it.  Your next appointment:   2 month(s)  Provider:   You may see Dr. Darliss or one of the following Advanced Practice Providers on your designated Care Team:   Lonni Meager, NP Lesley Maffucci, PA-C Bernardino Bring, PA-C Cadence Heil, PA-C Tylene Lunch, NP Barnie Hila, NP    We recommend signing up for the patient portal called MyChart.  Sign up information is provided on this After Visit Summary.  MyChart is used to connect with patients for Virtual Visits (Telemedicine).  Patients are able to view lab/test results, encounter notes, upcoming appointments, etc.  Non-urgent messages can be sent to your provider as well.   To learn more about what  you can do with MyChart, go to [email or web address].          Signed, Redell Darliss, MD  05/31/2024 10:29 AM    [facility]

## 2024-05-31 NOTE — Patient Instructions (Signed)
 Medication Instructions:  Your physician recommends that you continue on your current medications as directed. Please refer to the Current Medication list given to you today.   *If you need a refill on your cardiac medications before your next appointment, please call your pharmacy*  Lab Work: No labs ordered today  If you have labs (blood work) drawn today and your tests are completely normal, you will receive your results only by: MyChart Message (if you have MyChart) OR A paper copy in the mail If you have any lab test that is abnormal or we need to change your treatment, we will call you to review the results.  Testing/Procedures: Your physician has requested that you have an echocardiogram. Echocardiography is a painless test that uses sound waves to create images of your heart. It provides your doctor with information about the size and shape of your heart and how well your heart's chambers and valves are working.   You may receive an ultrasound enhancing agent through an IV if needed to better visualize your heart during the echo. This procedure takes approximately one hour.  There are no restrictions for this procedure.  This will take place at [address] [facility] [address]) [address], [place] [address]  Please note: We ask at that you not bring children with you during ultrasound (echo/ vascular) testing. Due to room size and safety concerns, children are not allowed in the ultrasound rooms during exams. Our front office staff cannot provide observation of children in our lobby area while testing is being conducted. An adult accompanying a patient to their appointment will only be allowed in the ultrasound room at the discretion of the ultrasound technician under special circumstances. We apologize for any inconvenience.   Follow-Up: At [facility], you and your health needs are our priority.  As part of our continuing mission to provide you with exceptional heart  care, our providers are all part of one team.  This team includes your primary Cardiologist (physician) and Advanced Practice Providers or APPs (Physician Assistants and Nurse Practitioners) who all work together to provide you with the care you need, when you need it.  Your next appointment:   2 month(s)  Provider:   You may see Dr. Junnie Olives or one of the following Advanced Practice Providers on your designated Care Team:   Laneta Pintos, NP Gildardo Labrador, PA-C Varney Gentleman, PA-C Cadence Ettrick, PA-C Ronald Cockayne, NP Morey Ar, NP    We recommend signing up for the patient portal called "MyChart".  Sign up information is provided on this After Visit Summary.  MyChart is used to connect with patients for Virtual Visits (Telemedicine).  Patients are able to view lab/test results, encounter notes, upcoming appointments, etc.  Non-urgent messages can be sent to your provider as well.   To learn more about what you can do with MyChart, go to [email or web address].

## 2024-07-02 ENCOUNTER — Ambulatory Visit: Attending: Cardiology

## 2024-07-02 ENCOUNTER — Ambulatory Visit: Admitting: Student

## 2024-07-02 ENCOUNTER — Encounter: Payer: Self-pay | Admitting: Student

## 2024-07-02 VITALS — BP 130/84 | HR 92 | Ht 71.0 in | Wt 294.0 lb

## 2024-07-02 DIAGNOSIS — Z01810 Encounter for preprocedural cardiovascular examination: Secondary | ICD-10-CM | POA: Diagnosis not present

## 2024-07-02 DIAGNOSIS — I350 Nonrheumatic aortic (valve) stenosis: Secondary | ICD-10-CM

## 2024-07-02 DIAGNOSIS — Q2381 Bicuspid aortic valve: Secondary | ICD-10-CM

## 2024-07-02 HISTORY — DX: Nonrheumatic aortic (valve) stenosis: I35.0

## 2024-07-02 LAB — ECHOCARDIOGRAM COMPLETE
AR max vel: 0.82 cm2
AV Area VTI: 0.84 cm2
AV Area mean vel: 0.89 cm2
AV Mean grad: 76 mmHg
AV Peak grad: 143 mmHg
Ao pk vel: 5.98 m/s
Area-P 1/2: 3.42 cm2
S' Lateral: 4.05 cm

## 2024-07-02 NOTE — Progress Notes (Addendum)
 Cardiology Clinic Note   Date: 07/02/2024 ID: Jerry Parrish, Filter 11/13/1965, MRN [id number]  Primary Cardiologist:  Redell Cave, MD  Chief Complaint   Jerry Parrish is a 58 y.o. male who presents to the clinic today for follow up after testing.   Patient Profile   Jerry Parrish is followed by Dr. Cave for the history outlined below.      Past medical history significant for: Aortic stenosis. Echo 07/02/2024: EF 55 to 60%.  No RWMA.  Moderate LVH.  Grade I DD.  Normal RV size/function.  Moderate LAE.  Mild to moderate MR.  Mild AI.  Aortic valve with indeterminate number of cusps, consider bicuspid valve.  Severe aortic stenosis, mean gradient 76 mmHg, valve area 0.84 cm. GERD.  In summary, patient was first seen by Dr. Cave on 05/31/2024 for shortness of breath. He reported being diagnosed with bicuspid aortic valve >15 years ago. He underwent echo which demonstrated severe aortic stenosis.      History of Present Illness    Today, patient is here to go over results of echo performed this morning. Patient denies shortness of breath, lower extremity edema, orthopnea or PND. He did experience mild ankle edema after camping that resolved on its own. He gets mildly dyspneic with heavier exertion that does not require rest to resolve.  No chest pain, pressure, or tightness. No palpitations.  He reports he snores but has never been told he stops breathing in his sleep. He does not have daytime somnolence. He has never had a sleep study. Discussed results of echo in detail. All questions answered.     ROS: All other systems reviewed and are otherwise negative except as noted in History of Present Illness.  EKGs/Labs Reviewed    EKG Interpretation Date/Time:  Friday July 02 2024 15:20:27 EDT Ventricular Rate:  78 PR Interval:  152 QRS Duration:  128 QT Interval:  424 QTC Calculation: 483 R Axis:   -59  Text Interpretation: Normal sinus rhythm Left axis  deviation Left ventricular hypertrophy with QRS widening and repolarization abnormality ( R in aVL , Cornell product , Romhilt-Estes ) When compared with ECG of 31-May-2024 09:24, No significant change was found Confirmed by Loistine Sober [phone]) on 07/02/2024 4:38:15 PM    Physical Exam    VS:  BP 130/84 (BP Location: Left Arm, Patient Position: Sitting, Cuff Size: Large)   Pulse 92   Ht 5' 11 (1.803 m)   Wt 294 lb (133.4 kg)   SpO2 97%   BMI 41.00 kg/m  , BMI Body mass index is 41 kg/m.  GEN: Well nourished, well developed, in no acute distress. Neck: No JVD or carotid bruits. Cardiac:  RRR.  3/6 systolic murmur. No rubs or gallops.   Respiratory:  Respirations regular and unlabored. Clear to auscultation without rales, wheezing or rhonchi. GI: Soft, nontender, nondistended. Extremities: Radials/DP/PT 2+ and equal bilaterally. No clubbing or cyanosis. No edema.  Skin: Warm and dry, no rash. Neuro: Strength intact.  Assessment & Plan   Aortic stenosis  Echo today demonstrated normal LV/RV function, mild to moderate MR, mild AI, severe aortic stenosis 76 mmHg. Patient is asymptomatic. He gets mildly dyspneic with heavier exertion but none with routine activities. No lightheadedness dizziness, presyncope or syncope reported.  Euvolemic and well compensated on exam. 3/6 systolic murmur - Schedule R/LHC.  - CBC and BMP today.  - Refer to CT surgery.   Disposition: CBC and BMP today. R/LHC. CT surgery referral.  Return in 3 weeks or sooner as needed.      Informed Consent   Shared Decision Making/Informed Consent The risks [stroke (1 in 1000), death (1 in 1000), kidney failure [usually temporary] (1 in 500), bleeding (1 in 200), allergic reaction [possibly serious] (1 in 200)], benefits (diagnostic support and management of coronary artery disease) and alternatives of a cardiac catheterization were discussed in detail with Mr. Yang and he is willing to proceed.       Signed, Jerry Parrish. Satina Jerrell, DNP, NP-C

## 2024-07-02 NOTE — H&P (View-Only) (Signed)
 Cardiology Clinic Note   Date: 07/02/2024 ID: Jerry Parrish, Filter 11/13/1965, MRN [id number]  Primary Cardiologist:  Redell Cave, MD  Chief Complaint   Jerry Parrish is a 58 y.o. male who presents to the clinic today for follow up after testing.   Patient Profile   Jerry Parrish is followed by Dr. Cave for the history outlined below.      Past medical history significant for: Aortic stenosis. Echo 07/02/2024: EF 55 to 60%.  No RWMA.  Moderate LVH.  Grade I DD.  Normal RV size/function.  Moderate LAE.  Mild to moderate MR.  Mild AI.  Aortic valve with indeterminate number of cusps, consider bicuspid valve.  Severe aortic stenosis, mean gradient 76 mmHg, valve area 0.84 cm. GERD.  In summary, patient was first seen by Dr. Cave on 05/31/2024 for shortness of breath. He reported being diagnosed with bicuspid aortic valve >15 years ago. He underwent echo which demonstrated severe aortic stenosis.      History of Present Illness    Today, patient is here to go over results of echo performed this morning. Patient denies shortness of breath, lower extremity edema, orthopnea or PND. He did experience mild ankle edema after camping that resolved on its own. He gets mildly dyspneic with heavier exertion that does not require rest to resolve.  No chest pain, pressure, or tightness. No palpitations.  He reports he snores but has never been told he stops breathing in his sleep. He does not have daytime somnolence. He has never had a sleep study. Discussed results of echo in detail. All questions answered.     ROS: All other systems reviewed and are otherwise negative except as noted in History of Present Illness.  EKGs/Labs Reviewed    EKG Interpretation Date/Time:  Friday July 02 2024 15:20:27 EDT Ventricular Rate:  78 PR Interval:  152 QRS Duration:  128 QT Interval:  424 QTC Calculation: 483 R Axis:   -59  Text Interpretation: Normal sinus rhythm Left axis  deviation Left ventricular hypertrophy with QRS widening and repolarization abnormality ( R in aVL , Cornell product , Romhilt-Estes ) When compared with ECG of 31-May-2024 09:24, No significant change was found Confirmed by Loistine Sober [phone]) on 07/02/2024 4:38:15 PM    Physical Exam    VS:  BP 130/84 (BP Location: Left Arm, Patient Position: Sitting, Cuff Size: Large)   Pulse 92   Ht 5' 11 (1.803 m)   Wt 294 lb (133.4 kg)   SpO2 97%   BMI 41.00 kg/m  , BMI Body mass index is 41 kg/m.  GEN: Well nourished, well developed, in no acute distress. Neck: No JVD or carotid bruits. Cardiac:  RRR.  3/6 systolic murmur. No rubs or gallops.   Respiratory:  Respirations regular and unlabored. Clear to auscultation without rales, wheezing or rhonchi. GI: Soft, nontender, nondistended. Extremities: Radials/DP/PT 2+ and equal bilaterally. No clubbing or cyanosis. No edema.  Skin: Warm and dry, no rash. Neuro: Strength intact.  Assessment & Plan   Aortic stenosis  Echo today demonstrated normal LV/RV function, mild to moderate MR, mild AI, severe aortic stenosis 76 mmHg. Patient is asymptomatic. He gets mildly dyspneic with heavier exertion but none with routine activities. No lightheadedness dizziness, presyncope or syncope reported.  Euvolemic and well compensated on exam. 3/6 systolic murmur - Schedule R/LHC.  - CBC and BMP today.  - Refer to CT surgery.   Disposition: CBC and BMP today. R/LHC. CT surgery referral.  Return in 3 weeks or sooner as needed.      Informed Consent   Shared Decision Making/Informed Consent The risks [stroke (1 in 1000), death (1 in 1000), kidney failure [usually temporary] (1 in 500), bleeding (1 in 200), allergic reaction [possibly serious] (1 in 200)], benefits (diagnostic support and management of coronary artery disease) and alternatives of a cardiac catheterization were discussed in detail with Mr. Yang and he is willing to proceed.       Signed, Jerry Parrish. Jerry Jerrell, DNP, NP-C

## 2024-07-02 NOTE — Patient Instructions (Signed)
 Medication Instructions:  Your physician recommends that you continue on your current medications as directed. Please refer to the Current Medication list given to you today.   *If you need a refill on your cardiac medications before your next appointment, please call your pharmacy*  Lab Work: Your provider would like for you to have following labs drawn today BMet, CBC.   If you have labs (blood work) drawn today and your tests are completely normal, you will receive your results only by: MyChart Message (if you have MyChart) OR A paper copy in the mail If you have any lab test that is abnormal or we need to change your treatment, we will call you to review the results.  Testing/Procedures:  [place] [facility] DEPT OF [place]. [facility] [place] [address] [place] [address] Dept: [phone] Loc: [phone]  Jerry Parrish  07/02/2024  You are scheduled for a Cardiac Catheterization on Friday, September 5 with Dr. Lonni End.  1. Please arrive at the [facility] Entrance of [facility], [address], [place] [address] at 9:30 AM (This is One hour(s) prior to your procedure time).  Proceed to the Check-In Desk directly inside the entrance.  Procedure Parking: Use the entrance off of the [address] side of the hospital. Turn right upon entering and follow the driveway to parking that is directly in front of the Heart & Vascular Center. There is no valet parking available at this entrance, however there is an awning directly in front of the Heart & Vascular Center for drop off/ pick up for patients.  Special note: Every effort is made to have your procedure done on time. Please understand that emergencies sometimes delay scheduled procedures.  2. Diet: NPO: Nothing to eat OR drink after midnight. (For TEE and Cath the same day)   3. Hydration: You need to be well hydrated before your  procedure. On September 5, you may drink approved liquids (see below) until 2 hours before the procedure, with 16 oz of water as your last intake.   List of approved liquids water, clear juice, clear tea, black coffee, fruit juices, non-citric and without pulp, carbonated beverages, Gatorade, Kool -Aid, plain Jello-O and plain ice popsicles.  4. Labs: You will need to have blood drawn on Friday, August 29 at [facility] Entrance, Go to 1st desk on your right to register.  Address: [address] [place], [place] [address]  Open: 8am - 5pm  Phone: [phone]. You do not need to be fasting.  5. Medication instructions in preparation for your procedure:   Contrast Allergy: No   *For reference purposes while preparing patient instructions.   Delete this med list prior to printing instructions for patient.*   On the morning of your procedure, take your any morning medicines NOT listed above.  You may use sips of water.  6. Plan to go home the same day, you will only stay overnight if medically necessary. 7. Bring a current list of your medications and current insurance cards. 8. You MUST have a responsible person to drive you home. 9. Someone MUST be with you the first 24 hours after you arrive home or your discharge will be delayed. 10. Please wear clothes that are easy to get on and off and wear slip-on shoes.  Thank you for allowing us  to care for you!   -- [facility]   Your  cardiologist has referred you to CT Surgery  We have attached their office location and phone number below.  Please allow them 3-5 business days to reach out to you to make an appointment.  If you have not heard from their office within that time, please call them to schedule your appointment.    Follow-Up: At [facility], you and your health needs are our priority.  As part of our continuing mission to provide you with exceptional heart care, our providers are all  part of one team.  This team includes your primary Cardiologist (physician) and Advanced Practice Providers or APPs (Physician Assistants and Nurse Practitioners) who all work together to provide you with the care you need, when you need it.  Your next appointment:   3 week(s)  Provider:   Barnie Hila, NP    We recommend signing up for the patient portal called MyChart.  Sign up information is provided on this After Visit Summary.  MyChart is used to connect with patients for Virtual Visits (Telemedicine).  Patients are able to view lab/test results, encounter notes, upcoming appointments, etc.  Non-urgent messages can be sent to your provider as well.   To learn more about what you can do with MyChart, go to [email or web address].

## 2024-07-03 ENCOUNTER — Ambulatory Visit: Payer: Self-pay | Admitting: Student

## 2024-07-03 LAB — CBC
Hematocrit: 43.2 % (ref 37.5–51.0)
Hemoglobin: 13.9 g/dL (ref 13.0–17.7)
MCH: 27.5 pg (ref 26.6–33.0)
MCHC: 32.2 g/dL (ref 31.5–35.7)
MCV: 86 fL (ref 79–97)
Platelets: 284 x10E3/uL (ref 150–450)
RBC: 5.05 x10E6/uL (ref 4.14–5.80)
RDW: 12.9 % (ref 11.6–15.4)
WBC: 6.9 x10E3/uL (ref 3.4–10.8)

## 2024-07-03 LAB — BASIC METABOLIC PANEL WITH GFR
BUN/Creatinine Ratio: 12 (ref 9–20)
BUN: 13 mg/dL (ref 6–24)
CO2: 22 mmol/L (ref 20–29)
Calcium: 9.5 mg/dL (ref 8.7–10.2)
Chloride: 100 mmol/L (ref 96–106)
Creatinine, Ser: 1.06 mg/dL (ref 0.76–1.27)
Glucose: 86 mg/dL (ref 70–99)
Potassium: 4.5 mmol/L (ref 3.5–5.2)
Sodium: 139 mmol/L (ref 134–144)
eGFR: 82 mL/min/1.73 (ref >59)

## 2024-07-08 ENCOUNTER — Telehealth: Payer: Self-pay | Admitting: *Deleted

## 2024-07-08 ENCOUNTER — Ambulatory Visit: Payer: Self-pay | Admitting: Cardiology

## 2024-07-08 NOTE — Telephone Encounter (Signed)
 Left voicemail message to call back for review of instructions.  Called to confirm/remind patient of their procedure.   Scheduled for: R/L heart cath  []  Date 07/09/24  []  Time 10:30 am []  Arrival time 09:30 am []  Location [facility]   []  Designated Driver  []  Instructions, time, and location reviewed with patient    [x]  H&P within 30 days  [x]  EKG within 30 days  [x]  Orders  [x]  Labs  [x]  GFR 82  []  Diet  []  Medication instructions    []  Left voicemail message to call back.

## 2024-07-09 ENCOUNTER — Encounter
Admission: RE | Disposition: A | Discharge status: Home / Self Care | Payer: Self-pay | Source: Home / Self Care | Attending: Internal Medicine

## 2024-07-09 ENCOUNTER — Ambulatory Visit
Admission: RE | Admit: 2024-07-09 | Discharge: 2024-07-09 | Disposition: A | Discharge status: Home / Self Care | Attending: Internal Medicine | Admitting: Internal Medicine

## 2024-07-09 ENCOUNTER — Other Ambulatory Visit: Payer: Self-pay

## 2024-07-09 ENCOUNTER — Encounter: Payer: Self-pay | Admitting: Internal Medicine

## 2024-07-09 DIAGNOSIS — I350 Nonrheumatic aortic (valve) stenosis: Secondary | ICD-10-CM | POA: Insufficient documentation

## 2024-07-09 HISTORY — PX: RIGHT HEART CATH AND CORONARY ANGIOGRAPHY: CATH118264

## 2024-07-09 LAB — POCT I-STAT EG7
Acid-base deficit: 1 mmol/L (ref 0.0–2.0)
Bicarbonate: 25.9 mmol/L (ref 20.0–28.0)
Calcium, Ion: 1.15 mmol/L (ref 1.15–1.40)
HCT: 36 % — ABNORMAL LOW (ref 39.0–52.0)
Hemoglobin: 12.2 g/dL — ABNORMAL LOW (ref 13.0–17.0)
O2 Saturation: 62 %
Potassium: 4.1 mmol/L (ref 3.5–5.1)
Sodium: 140 mmol/L (ref 135–145)
TCO2: 27 mmol/L (ref 22–32)
pCO2, Ven: 49.7 mmHg (ref 44–60)
pH, Ven: 7.326 (ref 7.25–7.43)
pO2, Ven: 35 mmHg (ref 32–45)

## 2024-07-09 LAB — POCT I-STAT 7, (LYTES, BLD GAS, ICA,H+H)
Acid-Base Excess: 0 mmol/L (ref 0.0–2.0)
Bicarbonate: 25.8 mmol/L (ref 20.0–28.0)
Calcium, Ion: 1.18 mmol/L (ref 1.15–1.40)
HCT: 36 % — ABNORMAL LOW (ref 39.0–52.0)
Hemoglobin: 12.2 g/dL — ABNORMAL LOW (ref 13.0–17.0)
O2 Saturation: 95 %
Potassium: 4.2 mmol/L (ref 3.5–5.1)
Sodium: 139 mmol/L (ref 135–145)
TCO2: 27 mmol/L (ref 22–32)
pCO2 arterial: 45.1 mmHg (ref 32–48)
pH, Arterial: 7.365 (ref 7.35–7.45)
pO2, Arterial: 80 mmHg — ABNORMAL LOW (ref 83–108)

## 2024-07-09 SURGERY — RIGHT HEART CATH AND CORONARY ANGIOGRAPHY
Anesthesia: Moderate Sedation | Laterality: Bilateral

## 2024-07-09 MED ORDER — FENTANYL CITRATE (PF) 100 MCG/2ML IJ SOLN
INTRAMUSCULAR | Status: AC
  Filled 2024-07-09: qty 2

## 2024-07-09 MED ORDER — SODIUM CHLORIDE 0.9% FLUSH
3.0000 mL | INTRAVENOUS | Status: DC | PRN
Start: 2024-07-09 — End: 2024-07-09

## 2024-07-09 MED ORDER — ASPIRIN 81 MG PO CHEW
CHEWABLE_TABLET | ORAL | Status: AC
  Filled 2024-07-09: qty 1

## 2024-07-09 MED ORDER — LIDOCAINE HCL (PF) 1 % IJ SOLN
INTRAMUSCULAR | Status: DC | PRN
  Administered 2024-07-09 (×2): 2 mL

## 2024-07-09 MED ORDER — FENTANYL CITRATE (PF) 100 MCG/2ML IJ SOLN
INTRAMUSCULAR | Status: DC | PRN
  Administered 2024-07-09: 25 ug via INTRAVENOUS

## 2024-07-09 MED ORDER — VERAPAMIL HCL 2.5 MG/ML IV SOLN
INTRAVENOUS | Status: DC | PRN
  Administered 2024-07-09 (×2): 2.5 mg via INTRA_ARTERIAL

## 2024-07-09 MED ORDER — SODIUM CHLORIDE 0.9% FLUSH: 3.0000 mL | Freq: Two times a day (BID) | INTRAVENOUS | Status: DC

## 2024-07-09 MED ORDER — HEPARIN SODIUM (PORCINE) 1000 UNIT/ML IJ SOLN
INTRAMUSCULAR | Status: AC
  Filled 2024-07-09: qty 10

## 2024-07-09 MED ORDER — HEPARIN (PORCINE) IN NACL 1000-0.9 UT/500ML-% IV SOLN
INTRAVENOUS | Status: DC | PRN
  Administered 2024-07-09: 1000 mL

## 2024-07-09 MED ORDER — FREE WATER
250.0000 mL | Freq: Once | Status: AC
  Administered 2024-07-09: 250 mL via ORAL

## 2024-07-09 MED ORDER — LIDOCAINE HCL 1 % IJ SOLN
INTRAMUSCULAR | Status: AC
  Filled 2024-07-09: qty 20

## 2024-07-09 MED ORDER — HEPARIN SODIUM (PORCINE) 1000 UNIT/ML IJ SOLN
INTRAMUSCULAR | Status: DC | PRN
  Administered 2024-07-09: 5000 [IU] via INTRAVENOUS

## 2024-07-09 MED ORDER — VERAPAMIL HCL 2.5 MG/ML IV SOLN
INTRAVENOUS | Status: AC
  Filled 2024-07-09: qty 2

## 2024-07-09 MED ORDER — IOHEXOL 300 MG/ML  SOLN
INTRAMUSCULAR | Status: DC | PRN
  Administered 2024-07-09: 45 mL

## 2024-07-09 MED ORDER — LABETALOL HCL 5 MG/ML IV SOLN: 5.0000 mg | INTRAVENOUS | Status: DC | PRN

## 2024-07-09 MED ORDER — SODIUM CHLORIDE 0.9 % IV SOLN: 250.0000 mL | INTRAVENOUS | Status: DC | PRN

## 2024-07-09 MED ORDER — FREE WATER
500.0000 mL | Freq: Once | Status: AC
  Administered 2024-07-09: 237 mL via ORAL

## 2024-07-09 MED ORDER — SODIUM CHLORIDE 0.9% FLUSH: 3.0000 mL | INTRAVENOUS | Status: DC | PRN

## 2024-07-09 MED ORDER — MIDAZOLAM HCL 2 MG/2ML IJ SOLN
INTRAMUSCULAR | Status: DC | PRN
  Administered 2024-07-09: 1 mg via INTRAVENOUS

## 2024-07-09 MED ORDER — HYDRALAZINE HCL 20 MG/ML IJ SOLN: 5.0000 mg | INTRAMUSCULAR | Status: DC | PRN

## 2024-07-09 MED ORDER — ASPIRIN 81 MG PO CHEW
81.0000 mg | CHEWABLE_TABLET | ORAL | Status: AC
  Administered 2024-07-09: 81 mg via ORAL

## 2024-07-09 MED ORDER — HEPARIN (PORCINE) IN NACL 1000-0.9 UT/500ML-% IV SOLN
INTRAVENOUS | Status: AC
  Filled 2024-07-09: qty 1000

## 2024-07-09 MED ORDER — MIDAZOLAM HCL 2 MG/2ML IJ SOLN
INTRAMUSCULAR | Status: AC
  Filled 2024-07-09: qty 2

## 2024-07-09 MED ORDER — ACETAMINOPHEN 325 MG PO TABS: 650.0000 mg | ORAL_TABLET | ORAL | Status: DC | PRN

## 2024-07-09 MED ORDER — SODIUM CHLORIDE 0.9 % IV SOLN
250.0000 mL | INTRAVENOUS | Status: DC | PRN
  Administered 2024-07-09: 250 mL via INTRAVENOUS

## 2024-07-09 MED ORDER — ONDANSETRON HCL 4 MG/2ML IJ SOLN: 4.0000 mg | Freq: Four times a day (QID) | INTRAMUSCULAR | Status: DC | PRN

## 2024-07-09 SURGICAL SUPPLY — 12 items
CATH BALLN WEDGE 5F 110CM (CATHETERS) IMPLANT
CATH INFINITI 5 FR MPA2 (CATHETERS) IMPLANT
CATH INFINITI 5FR JL4 (CATHETERS) IMPLANT
CATH INFINITI AMBI 5FR TG (CATHETERS) IMPLANT
DEVICE RAD TR BAND REGULAR (VASCULAR PRODUCTS) IMPLANT
DRAPE BRACHIAL (DRAPES) IMPLANT
GLIDESHEATH SLEND SS 6F .021 (SHEATH) IMPLANT
GUIDEWIRE INQWIRE 1.5J.035X260 (WIRE) IMPLANT
PACK CARDIAC CATH (CUSTOM PROCEDURE TRAY) ×1 IMPLANT
SET ATX-X65L (MISCELLANEOUS) IMPLANT
SHEATH GLIDE SLENDER 4/5FR (SHEATH) IMPLANT
STATION PROTECTION PRESSURIZED (MISCELLANEOUS) IMPLANT

## 2024-07-09 NOTE — Interval H&P Note (Signed)
 History and Physical Interval Note:  07/09/2024 10:23 AM  Jerry Parrish  has presented today for surgery, with the diagnosis of severe aortic stenosis.  The various methods of treatment have been discussed with the patient and family. After consideration of risks, benefits and other options for treatment, the patient has consented to  Procedure(s): RIGHT/LEFT HEART CATH AND CORONARY ANGIOGRAPHY (Bilateral) as a surgical intervention.  The patient's history has been reviewed, patient examined, no change in status, stable for surgery.  I have reviewed the patient's chart and labs.  Questions were answered to the patient's satisfaction.    Cath Lab Visit (complete for each Cath Lab visit)  Clinical Evaluation Leading to the Procedure:   ACS: No.  Non-ACS:    Anginal/Heart Failure Classification: NYHA class III  Anti-ischemic medical therapy: No Therapy  Non-Invasive Test Results: No non-invasive testing performed  Prior CABG: No previous CABG  Tiney Zipper

## 2024-07-12 ENCOUNTER — Other Ambulatory Visit: Payer: Self-pay

## 2024-07-12 DIAGNOSIS — I350 Nonrheumatic aortic (valve) stenosis: Secondary | ICD-10-CM

## 2024-07-14 ENCOUNTER — Ambulatory Visit (HOSPITAL_COMMUNITY)
Admission: RE | Admit: 2024-07-14 | Discharge: 2024-07-14 | Disposition: A | Discharge status: Home / Self Care | Source: Ambulatory Visit

## 2024-07-14 ENCOUNTER — Other Ambulatory Visit: Payer: Self-pay

## 2024-07-14 DIAGNOSIS — I350 Nonrheumatic aortic (valve) stenosis: Secondary | ICD-10-CM

## 2024-07-14 MED ORDER — IOHEXOL 350 MG/ML SOLN: 100.0000 mL | Freq: Once | INTRAVENOUS | Status: DC | PRN

## 2024-07-15 ENCOUNTER — Ambulatory Visit

## 2024-07-15 ENCOUNTER — Other Ambulatory Visit: Payer: Self-pay | Admitting: *Deleted

## 2024-07-15 ENCOUNTER — Encounter

## 2024-07-15 VITALS — BP 135/88 | HR 80 | Resp 20 | Ht 71.0 in | Wt 195.0 lb

## 2024-07-15 DIAGNOSIS — I350 Nonrheumatic aortic (valve) stenosis: Secondary | ICD-10-CM

## 2024-07-15 DIAGNOSIS — I359 Nonrheumatic aortic valve disorder, unspecified: Secondary | ICD-10-CM | POA: Insufficient documentation

## 2024-07-15 DIAGNOSIS — Z6841 Body Mass Index (BMI) 40.0 and over, adult: Secondary | ICD-10-CM | POA: Insufficient documentation

## 2024-07-15 DIAGNOSIS — K219 Gastro-esophageal reflux disease without esophagitis: Secondary | ICD-10-CM | POA: Insufficient documentation

## 2024-07-15 DIAGNOSIS — K5900 Constipation, unspecified: Secondary | ICD-10-CM | POA: Insufficient documentation

## 2024-07-15 DIAGNOSIS — F418 Other specified anxiety disorders: Secondary | ICD-10-CM | POA: Insufficient documentation

## 2024-07-15 DIAGNOSIS — Z85820 Personal history of malignant melanoma of skin: Secondary | ICD-10-CM | POA: Insufficient documentation

## 2024-07-15 DIAGNOSIS — R1013 Epigastric pain: Secondary | ICD-10-CM | POA: Insufficient documentation

## 2024-07-15 DIAGNOSIS — I5030 Unspecified diastolic (congestive) heart failure: Secondary | ICD-10-CM | POA: Insufficient documentation

## 2024-07-15 NOTE — H&P (View-Only) (Signed)
 [address], [place] [address]             [phone]    Jerry Parrish [facility] Medical Record #[id number] Date of Birth: May 10, 1966  Referring: Loistine Sober, NP Primary Care: Kip Righter, MD Primary Cardiologist:Brian Darliss, MD  Chief Complaint:    Chief Complaint  Patient presents with   Aortic Stenosis    Surgical consult, Cardiac Cath 07/09/24/ ECHO 07/02/24    History of Present Illness:     Jerry Parrish is a 58 year old man who presents for surgical evaluation of severe aortic stenosis and ascending aortic aneurysm.  He has known he had a murmur since 58 years old, but recently had a repeat ECHO which demonstrated severe aortic stenosis (AVA 0.84, MG 76, Vmax 5.98), normal biventricular function and no other valvular abnormalities.  R/LHC showed no significant CAD with midly elevated filling pressure and low normal CO/CI.  CT scan demonstrated an enlarged ascending aorta (4.4 cm) with a normal root.  He denies chest pain, but reports shortness of breath and some dizzy spells.  Over the summer he was out camping in hot weather, when he developed dizziness with leg swelling and a general sense of feeling awful all over.  He suspected it was heat stroke and it got better on its own.  He reports some leg swelling that worsens with walking.   He is a non-smoker and drinks alcohol rarely. He denies history of stroke or kidney issues.  He works as a Scientist, forensic.  Past Medical and Surgical History: Previous Chest Surgery: No Previous Chest Radiation: No Diabetes Mellitus: No.  HbA1C N/A Creatinine:  Lab Results  Component Value Date   CREATININE 1.06 07/02/2024   CREATININE 1.02 07/18/2013     Past Medical History:  Diagnosis Date   Carpal tunnel syndrome    Chest pain    Depression    Heart murmur    Hemorrhoids    HTN (hypertension)    Obesity    T wave inversion in EKG     Past Surgical History:   Procedure Laterality Date   MELANOMA EXCISION     left upper arm   RIGHT HEART CATH AND CORONARY ANGIOGRAPHY Bilateral 07/09/2024   Procedure: RIGHT HEART CATH AND CORONARY ANGIOGRAPHY;  Surgeon: Mady Bruckner, MD;  Location: [facility] INVASIVE CV LAB;  Service: Cardiovascular;  Laterality: Bilateral;    Social History:  Social History   Tobacco Use  Smoking Status Never  Smokeless Tobacco Not on file    Social History   Substance and Sexual Activity  Alcohol Use Not Currently     No Known Allergies  Medications: Asprin: No Statin: No Beta Blocker: No Ace Inhibitor: No Anti-Coagulation: No  Current Outpatient Medications  Medication Sig Dispense Refill   cyclobenzaprine  (FLEXERIL ) 10 MG tablet Take 10 mg by mouth every 8 (eight) hours as needed for muscle spasms.     naproxen (NAPROSYN) 500 MG tablet Take 500 mg by mouth 2 (two) times daily as needed for moderate pain (pain score 4-6).     omeprazole (PRILOSEC) 20 MG capsule Take 20 mg by mouth daily. (Patient taking differently: Take 20 mg by mouth daily as needed (Indigestion).)     PARoxetine  (PAXIL ) 10 MG tablet Take 10 mg by mouth every morning.     No current facility-administered medications for this visit.    (Not in a hospital admission)   Family History  Problem  Relation Age of Onset   Hypertension Mother    Prostate cancer Father    Diabetes Mellitus I Father      Review of Systems:   Review of Systems  Constitutional:  Positive for diaphoresis and malaise/fatigue. Negative for chills and fever.  Eyes:  Negative for blurred vision.  Cardiovascular:  Positive for leg swelling. Negative for chest pain and palpitations.  Gastrointestinal:  Negative for nausea and vomiting.  Neurological:  Positive for dizziness. Negative for headaches.      Physical Exam: There were no vitals taken for this visit. Physical Exam Constitutional:      Appearance: Normal appearance.  HENT:     Head: Normocephalic  and atraumatic.  Cardiovascular:     Rate and Rhythm: Normal rate and regular rhythm.     Heart sounds: Murmur heard.  Pulmonary:     Effort: Pulmonary effort is normal.     Breath sounds: Normal breath sounds.  Abdominal:     General: Bowel sounds are normal.     Palpations: Abdomen is soft.  Skin:    General: Skin is warm and dry.  Neurological:     General: No focal deficit present.     Mental Status: He is alert and oriented to person, place, and time.       Diagnostic Studies & Laboratory data: Cardiac Studies & Procedures   ______________________________________________________________________________________________ CARDIAC CATHETERIZATION  CARDIAC CATHETERIZATION 07/09/2024  Conclusion Conclusions: No angiographically significant coronary artery disease. Mildly elevated left heart, right heart, and pulmonary artery pressures. Low normal to mildly reduced Fick cardiac output/index.  Recommendations: Proceed with cardiac surgery consultation for further management of severe aortic valve stenosis. Primary prevention of coronary artery disease. Sodium restriction and moderation of fluid intake; defer addition of standing diuretic in the setting of severe aortic stenosis.  Lonni Hanson, MD [facility]  Findings Coronary Findings Diagnostic  Dominance: Right  Left Main Vessel is large. Vessel is angiographically normal.  Left Anterior Descending Vessel is large. Vessel is angiographically normal.  First Diagonal Branch Vessel is small in size.  Second Diagonal Branch Vessel is small in size.  Third Diagonal Branch Vessel is small in size.  Ramus Intermedius Vessel is large. Vessel is angiographically normal.  Left Circumflex Vessel is large.  First Obtuse Marginal Branch Vessel is moderate in size.  Second Obtuse Marginal Branch Vessel is small in size.  Right Coronary Artery Vessel is large. Vessel is angiographically normal. Anterior  take-off.  Right Posterior Descending Artery Vessel is large in size.  Right Posterior Atrioventricular Artery Vessel is large in size.  First Right Posterolateral Branch Vessel is large in size.  Second Right Posterolateral Branch Vessel is small in size.  Intervention  No interventions have been documented.     ECHOCARDIOGRAM  ECHOCARDIOGRAM COMPLETE 07/02/2024  Narrative ECHOCARDIOGRAM REPORT    Patient Name:   Jerry Parrish [facility] Date of Exam: 07/02/2024 Medical Rec #:  [id number]        Height:       70.5 in Accession #:    [id number]       Weight:       296.0 lb Date of Birth:  [birth year]         BSA:          2.479 m Patient Age:    58 years         BP:           122/86 mmHg Patient Gender: M  HR:           68 bpm. Exam Location:  [facility]  Procedure: 2D Echo, Cardiac Doppler and Color Doppler (Both Spectral and Color Flow Doppler were utilized during procedure).  Indications:    R01.1 Murmur; Q23.1 Bicuspid aortic valve  History:        Patient has no prior history of Echocardiogram examinations. Abnormal ECG, Aortic Valve Disease, Signs/Symptoms:Murmur, Chest Pain, Shortness of Breath and Dyspnea; Risk Factors:Hypertension and Non-Smoker.  Sonographer:    Doyal Point MHA, BS, RDCS Referring Phys: [id number] [facility]   Sonographer Comments: Patient is obese and Technically difficult study due to poor echo windows. Image acquisition challenging due to patient body habitus. IMPRESSIONS   1. Left ventricular ejection fraction, by estimation, is 55 to 60%. Left ventricular ejection fraction by PLAX is 62 %. The left ventricle has normal function. The left ventricle has no regional wall motion abnormalities. There is moderate left ventricular hypertrophy. Left ventricular diastolic parameters are consistent with Grade I diastolic dysfunction (impaired relaxation). 2. Right ventricular systolic function is normal. The right ventricular  size is normal. Tricuspid regurgitation signal is inadequate for assessing PA pressure. 3. Left atrial size was moderately dilated. 4. The mitral valve is normal in structure. Mild to moderate mitral valve regurgitation. No evidence of mitral stenosis. 5. The aortic valve has an indeterminant number of cusps, consider bicuspid valve. Aortic valve regurgitation is mild. Severe aortic valve stenosis. Aortic valve area, by VTI measures 0.84 cm. Aortic valve mean gradient measures 76.0 mmHg. Aortic valve Vmax measures 5.98 m/s. 6. The inferior vena cava is normal in size with greater than 50% respiratory variability, suggesting right atrial pressure of 3 mmHg.  FINDINGS Left Ventricle: Left ventricular ejection fraction, by estimation, is 55 to 60%. Left ventricular ejection fraction by PLAX is 62 %. The left ventricle has normal function. The left ventricle has no regional wall motion abnormalities. Strain was performed and the global longitudinal strain is indeterminate. The left ventricular internal cavity size was normal in size. There is moderate left ventricular hypertrophy. Left ventricular diastolic parameters are consistent with Grade I diastolic dysfunction (impaired relaxation).  Right Ventricle: The right ventricular size is normal. No increase in right ventricular wall thickness. Right ventricular systolic function is normal. Tricuspid regurgitation signal is inadequate for assessing PA pressure.  Left Atrium: Left atrial size was moderately dilated.  Right Atrium: Right atrial size was normal in size.  Pericardium: There is no evidence of pericardial effusion.  Mitral Valve: The mitral valve is normal in structure. There is mild thickening of the mitral valve leaflet(s). There is mild calcification of the mitral valve leaflet(s). Mild to moderate mitral valve regurgitation. No evidence of mitral valve stenosis.  Tricuspid Valve: The tricuspid valve is normal in structure. Tricuspid  valve regurgitation is not demonstrated. No evidence of tricuspid stenosis.  Aortic Valve: The aortic valve has an indeterminant number of cusps. Aortic valve regurgitation is mild. Severe aortic stenosis is present. Aortic valve mean gradient measures 76.0 mmHg. Aortic valve peak gradient measures 143.0 mmHg. Aortic valve area, by VTI measures 0.84 cm.  Pulmonic Valve: The pulmonic valve was normal in structure. Pulmonic valve regurgitation is not visualized. No evidence of pulmonic stenosis.  Aorta: The aortic root is normal in size and structure.  Venous: The inferior vena cava is normal in size with greater than 50% respiratory variability, suggesting right atrial pressure of 3 mmHg.  IAS/Shunts: No atrial level shunt detected by color flow Doppler.  Additional Comments: 3D was performed not requiring image post processing on an independent workstation and was indeterminate.   LEFT VENTRICLE PLAX 2D LV EF:         Left            Diastology ventricular     LV e' medial:    9.14 cm/s ejection        LV E/e' medial:  12.1 fraction by     LV e' lateral:   8.05 cm/s PLAX is 62      LV E/e' lateral: 13.8 %. LVIDd:         6.17 cm LVIDs:         4.05 cm LV PW:         1.43 cm LV IVS:        1.46 cm LVOT diam:     2.40 cm LV SV:         119 LV SV Index:   48 LVOT Area:     4.52 cm   LEFT ATRIUM              Index LA diam:        5.80 cm  2.34 cm/m LA Vol (A2C):   88.6 ml  35.74 ml/m LA Vol (A4C):   106.0 ml 42.76 ml/m LA Biplane Vol: 97.8 ml  39.45 ml/m AORTIC VALVE AV Area (Vmax):    0.82 cm AV Area (Vmean):   0.89 cm AV Area (VTI):     0.84 cm AV Vmax:           598.00 cm/s AV Vmean:          395.000 cm/s AV VTI:            1.410 m AV Peak Grad:      143.0 mmHg AV Mean Grad:      76.0 mmHg LVOT Vmax:         108.00 cm/s LVOT Vmean:        78.100 cm/s LVOT VTI:          0.262 m LVOT/AV VTI ratio: 0.19  AORTA Ao STJ diam: 3.4 cm  MITRAL VALVE MV Area  (PHT): 3.42 cm     SHUNTS MV Decel Time: 222 msec     Systemic VTI:  0.26 m MV E velocity: 111.00 cm/s  Systemic Diam: 2.40 cm MV A velocity: 125.00 cm/s MV E/A ratio:  0.89  Evalene Lunger MD Electronically signed by Evalene Lunger MD Signature Date/Time: 07/02/2024/8:17:33 AM    Final          ______________________________________________________________________________________________     EKG: NSR  CTA chest/abd/pelvis:  On my review, the ascending aorta measures ~ 4.4 x 4.3 cm but there is significant motion artifact.  The root is normal and < 4 cm.  I have independently reviewed the above radiologic studies and discussed with the patient   Recent Lab Findings: Lab Results  Component Value Date   WBC 6.9 07/02/2024   HGB 12.2 (L) 07/09/2024   HCT 36.0 (L) 07/09/2024   PLT 284 07/02/2024   GLUCOSE 86 07/02/2024   CHOL 128 07/18/2013   TRIG 45 07/18/2013   HDL 42 07/18/2013   LDLCALC 77 07/18/2013   ALT 26 07/18/2013   AST 18 07/18/2013   NA 139 07/09/2024   K 4.2 07/09/2024   CL 100 07/02/2024   CREATININE 1.06 07/02/2024   BUN 13 07/02/2024   CO2 22 07/02/2024   TSH 1.441 07/18/2013  HGBA1C 5.6 07/18/2013      Assessment / Plan:   Jerry Parrish is a healthy 59 year old man with history of bicuspid valve and severe aortic stenosis with ascending aortic aneurysm.  He has worsening symptoms of leg swelling and dyspnea, but continues to be very functional and active.  The quality of his CTA chest is poor with motion artifact, but on my own measurement, as well as that of a cardiology imager, the aorta measures at least 4.3 x 4.4 cm.  Guidelines recommend aortic replacement for patients with bicuspid valve having surgery with an aneurysm < 4.5 cm.  Given his young age and desire to avoid a second heart operation, we will replace his ascending aorta at the time of valve replacement.  I discussed the alternatives for valve replacement including mechanical and  bioprosthetic valves.  I counseled him that in his age group, there is no specific recommendation for mechanical vs biologic valve and that it is dependent on patient preference.  That being said, recent studies have demonstrated a survival benefit for mechanical valves in patients less than 46 years old.   I reviewed all the imaging studies with him and his wife and discussed the alternatives for treatment.  He would like to have his valve and all the enlarged aorta replaced to decrease the risk of aortic dissection.  He would like to use a mechanical valve and understands the need for lifelong anticoagulation.    I discussed the operative procedure with the patient and her husband including alternatives, benefits and risks; including but not limited to bleeding, blood transfusion, infection, stroke, myocardial infarction, graft failure, heart block requiring a permanent pacemaker, organ dysfunction, and death.  He understands and agrees to proceed.       Plan:   He will be scheduled for aortic valve replacement (mechanical valve) and replacement of ascending aorta (ie: Wheat procedure) using hypothermic circulatory arrest on Friday, 07/23/2024.   I spent 60 minutes performing this consultation and > 50% of this time was spent face to face counseling and coordinating the care of this patient's bicuspid aortic valve and ascending aortic aneurysm.   Con RAMAN Sanae Willetts 07/15/2024 3:59 PM

## 2024-07-15 NOTE — Patient Instructions (Signed)
 Our office will contact you about further testing and appointments prior to surgery.

## 2024-07-15 NOTE — Progress Notes (Signed)
 [address] [place] [address]             [phone]    Jerry Parrish [facility] Medical Record #[id number] Date of Birth: 06-Jan-1966  Referring: Loistine Sober, NP Primary Care: Kip Righter, MD Primary Cardiologist:Brian Darliss, MD  Chief Complaint:    Chief Complaint  Patient presents with   Aortic Stenosis    Surgical consult, Cardiac Cath 07/09/24/ ECHO 07/02/24    History of Present Illness:     Jerry Parrish is a 58 year old man who presents for surgical evaluation of severe aortic stenosis and ascending aortic aneurysm.  He has known he had a murmur since 58 years old, but recently had a repeat ECHO which demonstrated severe aortic stenosis (AVA 0.84, MG 76, Vmax 5.98), normal biventricular function and no other valvular abnormalities.  R/LHC showed no significant CAD with midly elevated filling pressure and low normal CO/CI.  CT scan demonstrated an enlarged ascending aorta (4.4 cm) with a normal root.  He denies chest pain, but reports shortness of breath and some dizzy spells.  Over the summer he was out camping in hot weather, when he developed dizziness with leg swelling and a general sense of feeling awful all over.  He suspected it was heat stroke and it got better on its own.  He reports some leg swelling that worsens with walking.   He is a non-smoker and drinks alcohol rarely. He denies history of stroke or kidney issues.  He works as a Scientist, forensic.  Past Medical and Surgical History: Previous Chest Surgery: No Previous Chest Radiation: No Diabetes Mellitus: No.  HbA1C N/A Creatinine:  Lab Results  Component Value Date   CREATININE 1.06 07/02/2024   CREATININE 1.02 07/18/2013     Past Medical History:  Diagnosis Date   Carpal tunnel syndrome    Chest pain    Depression    Heart murmur    Hemorrhoids    HTN (hypertension)    Obesity    T wave inversion in EKG     Past Surgical History:   Procedure Laterality Date   MELANOMA EXCISION     left upper arm   RIGHT HEART CATH AND CORONARY ANGIOGRAPHY Bilateral 07/09/2024   Procedure: RIGHT HEART CATH AND CORONARY ANGIOGRAPHY;  Surgeon: Mady Bruckner, MD;  Location: [facility] INVASIVE CV LAB;  Service: Cardiovascular;  Laterality: Bilateral;    Social History:  Social History   Tobacco Use  Smoking Status Never  Smokeless Tobacco Not on file    Social History   Substance and Sexual Activity  Alcohol Use Not Currently     No Known Allergies  Medications: Asprin: No Statin: No Beta Blocker: No Ace Inhibitor: No Anti-Coagulation: No  Current Outpatient Medications  Medication Sig Dispense Refill   cyclobenzaprine (FLEXERIL) 10 MG tablet Take 10 mg by mouth every 8 (eight) hours as needed for muscle spasms.     naproxen (NAPROSYN) 500 MG tablet Take 500 mg by mouth 2 (two) times daily as needed for moderate pain (pain score 4-6).     omeprazole (PRILOSEC) 20 MG capsule Take 20 mg by mouth daily. (Patient taking differently: Take 20 mg by mouth daily as needed (Indigestion).)     PARoxetine (PAXIL) 10 MG tablet Take 10 mg by mouth every morning.     No current facility-administered medications for this visit.    (Not in a hospital admission)   Family History  Problem  Relation Age of Onset   Hypertension Mother    Prostate cancer Father    Diabetes Mellitus I Father      Review of Systems:   Review of Systems  Constitutional:  Positive for diaphoresis and malaise/fatigue. Negative for chills and fever.  Eyes:  Negative for blurred vision.  Cardiovascular:  Positive for leg swelling. Negative for chest pain and palpitations.  Gastrointestinal:  Negative for nausea and vomiting.  Neurological:  Positive for dizziness. Negative for headaches.      Physical Exam: There were no vitals taken for this visit. Physical Exam Constitutional:      Appearance: Normal appearance.  HENT:     Head: Normocephalic  and atraumatic.  Cardiovascular:     Rate and Rhythm: Normal rate and regular rhythm.     Heart sounds: Murmur heard.  Pulmonary:     Effort: Pulmonary effort is normal.     Breath sounds: Normal breath sounds.  Abdominal:     General: Bowel sounds are normal.     Palpations: Abdomen is soft.  Skin:    General: Skin is warm and dry.  Neurological:     General: No focal deficit present.     Mental Status: He is alert and oriented to person, place, and time.       Diagnostic Studies & Laboratory data: Cardiac Studies & Procedures   ______________________________________________________________________________________________ CARDIAC CATHETERIZATION  CARDIAC CATHETERIZATION 07/09/2024  Conclusion Conclusions: No angiographically significant coronary artery disease. Mildly elevated left heart, right heart, and pulmonary artery pressures. Low normal to mildly reduced Fick cardiac output/index.  Recommendations: Proceed with cardiac surgery consultation for further management of severe aortic valve stenosis. Primary prevention of coronary artery disease. Sodium restriction and moderation of fluid intake; defer addition of standing diuretic in the setting of severe aortic stenosis.  Jerry Hanson, MD [facility]  Findings Coronary Findings Diagnostic  Dominance: Right  Left Main Vessel is large. Vessel is angiographically normal.  Left Anterior Descending Vessel is large. Vessel is angiographically normal.  First Diagonal Branch Vessel is small in size.  Second Diagonal Branch Vessel is small in size.  Third Diagonal Branch Vessel is small in size.  Ramus Intermedius Vessel is large. Vessel is angiographically normal.  Left Circumflex Vessel is large.  First Obtuse Marginal Branch Vessel is moderate in size.  Second Obtuse Marginal Branch Vessel is small in size.  Right Coronary Artery Vessel is large. Vessel is angiographically normal. Anterior  take-off.  Right Posterior Descending Artery Vessel is large in size.  Right Posterior Atrioventricular Artery Vessel is large in size.  First Right Posterolateral Branch Vessel is large in size.  Second Right Posterolateral Branch Vessel is small in size.  Intervention  No interventions have been documented.     ECHOCARDIOGRAM  ECHOCARDIOGRAM COMPLETE 07/02/2024  Narrative ECHOCARDIOGRAM REPORT    Patient Name:   Jerry Parrish [facility], [facility], [facility] Date of Exam: 07/02/2024 Medical Rec #:  [id number]        Height:       70.5 in Accession #:    [id number]       Weight:       296.0 lb Date of Birth:  December 03, 1965         BSA:          2.479 m Patient Age:    58 years         BP:           122/86 mmHg Patient Gender: M  HR:           68 bpm. Exam Location:  [place]  Procedure: 2D Echo, Cardiac Doppler and Color Doppler (Both Spectral and Color Flow Doppler were utilized during procedure).  Indications:    R01.1 Murmur; Q23.1 Bicuspid aortic valve  History:        Patient has no prior history of Echocardiogram examinations. Abnormal ECG, Aortic Valve Disease, Signs/Symptoms:Murmur, Chest Pain, Shortness of Breath and Dyspnea; Risk Factors:Hypertension and Non-Smoker.  Sonographer:    Jerry Parrish MHA, BS, RDCS Referring Phys: [id number] [facility]   Sonographer Comments: Patient is obese and Technically difficult study due to poor echo windows. Image acquisition challenging due to patient body habitus. IMPRESSIONS   1. Left ventricular ejection fraction, by estimation, is 55 to 60%. Left ventricular ejection fraction by PLAX is 62 %. The left ventricle has normal function. The left ventricle has no regional wall motion abnormalities. There is moderate left ventricular hypertrophy. Left ventricular diastolic parameters are consistent with Grade I diastolic dysfunction (impaired relaxation). 2. Right ventricular systolic function is normal. The right ventricular  size is normal. Tricuspid regurgitation signal is inadequate for assessing PA pressure. 3. Left atrial size was moderately dilated. 4. The mitral valve is normal in structure. Mild to moderate mitral valve regurgitation. No evidence of mitral stenosis. 5. The aortic valve has an indeterminant number of cusps, consider bicuspid valve. Aortic valve regurgitation is mild. Severe aortic valve stenosis. Aortic valve area, by VTI measures 0.84 cm. Aortic valve mean gradient measures 76.0 mmHg. Aortic valve Vmax measures 5.98 m/s. 6. The inferior vena cava is normal in size with greater than 50% respiratory variability, suggesting right atrial pressure of 3 mmHg.  FINDINGS Left Ventricle: Left ventricular ejection fraction, by estimation, is 55 to 60%. Left ventricular ejection fraction by PLAX is 62 %. The left ventricle has normal function. The left ventricle has no regional wall motion abnormalities. Strain was performed and the global longitudinal strain is indeterminate. The left ventricular internal cavity size was normal in size. There is moderate left ventricular hypertrophy. Left ventricular diastolic parameters are consistent with Grade I diastolic dysfunction (impaired relaxation).  Right Ventricle: The right ventricular size is normal. No increase in right ventricular wall thickness. Right ventricular systolic function is normal. Tricuspid regurgitation signal is inadequate for assessing PA pressure.  Left Atrium: Left atrial size was moderately dilated.  Right Atrium: Right atrial size was normal in size.  Pericardium: There is no evidence of pericardial effusion.  Mitral Valve: The mitral valve is normal in structure. There is mild thickening of the mitral valve leaflet(s). There is mild calcification of the mitral valve leaflet(s). Mild to moderate mitral valve regurgitation. No evidence of mitral valve stenosis.  Tricuspid Valve: The tricuspid valve is normal in structure. Tricuspid  valve regurgitation is not demonstrated. No evidence of tricuspid stenosis.  Aortic Valve: The aortic valve has an indeterminant number of cusps. Aortic valve regurgitation is mild. Severe aortic stenosis is present. Aortic valve mean gradient measures 76.0 mmHg. Aortic valve peak gradient measures 143.0 mmHg. Aortic valve area, by VTI measures 0.84 cm.  Pulmonic Valve: The pulmonic valve was normal in structure. Pulmonic valve regurgitation is not visualized. No evidence of pulmonic stenosis.  Aorta: The aortic root is normal in size and structure.  Venous: The inferior vena cava is normal in size with greater than 50% respiratory variability, suggesting right atrial pressure of 3 mmHg.  IAS/Shunts: No atrial level shunt detected by color flow Doppler.  Additional Comments: 3D was performed not requiring image post processing on an independent workstation and was indeterminate.   LEFT VENTRICLE PLAX 2D LV EF:         Left            Diastology ventricular     LV e' medial:    9.14 cm/s ejection        LV E/e' medial:  12.1 fraction by     LV e' lateral:   8.05 cm/s PLAX is 62      LV E/e' lateral: 13.8 %. LVIDd:         6.17 cm LVIDs:         4.05 cm LV PW:         1.43 cm LV IVS:        1.46 cm LVOT diam:     2.40 cm LV SV:         119 LV SV Index:   48 LVOT Area:     4.52 cm   LEFT ATRIUM              Index LA diam:        5.80 cm  2.34 cm/m LA Vol (A2C):   88.6 ml  35.74 ml/m LA Vol (A4C):   106.0 ml 42.76 ml/m LA Biplane Vol: 97.8 ml  39.45 ml/m AORTIC VALVE AV Area (Vmax):    0.82 cm AV Area (Vmean):   0.89 cm AV Area (VTI):     0.84 cm AV Vmax:           598.00 cm/s AV Vmean:          395.000 cm/s AV VTI:            1.410 m AV Peak Grad:      143.0 mmHg AV Mean Grad:      76.0 mmHg LVOT Vmax:         108.00 cm/s LVOT Vmean:        78.100 cm/s LVOT VTI:          0.262 m LVOT/AV VTI ratio: 0.19  AORTA Ao STJ diam: 3.4 cm  MITRAL VALVE MV Area  (PHT): 3.42 cm     SHUNTS MV Decel Time: 222 msec     Systemic VTI:  0.26 m MV E velocity: 111.00 cm/s  Systemic Diam: 2.40 cm MV A velocity: 125.00 cm/s MV E/A ratio:  0.89  Jerry Lunger MD Electronically signed by Jerry Lunger MD Signature Date/Time: 07/02/2024/8:17:33 AM    Final          ______________________________________________________________________________________________     EKG: NSR  CTA chest/abd/pelvis:  On my review, the ascending aorta measures ~ 4.4 x 4.3 cm but there is significant motion artifact.  The root is normal and < 4 cm.  I have independently reviewed the above radiologic studies and discussed with the patient   Recent Lab Findings: Lab Results  Component Value Date   WBC 6.9 07/02/2024   HGB 12.2 (L) 07/09/2024   HCT 36.0 (L) 07/09/2024   PLT 284 07/02/2024   GLUCOSE 86 07/02/2024   CHOL 128 07/18/2013   TRIG 45 07/18/2013   HDL 42 07/18/2013   LDLCALC 77 07/18/2013   ALT 26 07/18/2013   AST 18 07/18/2013   NA 139 07/09/2024   K 4.2 07/09/2024   CL 100 07/02/2024   CREATININE 1.06 07/02/2024   BUN 13 07/02/2024   CO2 22 07/02/2024   TSH 1.441 07/18/2013  HGBA1C 5.6 07/18/2013      Assessment / Plan:   Mr. Prevette is a healthy 58 year old man with history of bicuspid valve and severe aortic stenosis with ascending aortic aneurysm.  He has worsening symptoms of leg swelling and dyspnea, but continues to be very functional and active.  The quality of his CTA chest is poor with motion artifact, but on my own measurement, as well as that of a cardiology imager, the aorta measures at least 4.3 x 4.4 cm.  Guidelines recommend aortic replacement for patients with bicuspid valve having surgery with an aneurysm < 4.5 cm.  Given his young age and desire to avoid a second heart operation, we will replace his ascending aorta at the time of valve replacement.  I discussed the alternatives for valve replacement including mechanical and  bioprosthetic valves.  I counseled him that in his age group, there is no specific recommendation for mechanical vs biologic valve and that it is dependent on patient preference.  That being said, recent studies have demonstrated a survival benefit for mechanical valves in patients less than 57 years old.   I reviewed all the imaging studies with him and his wife and discussed the alternatives for treatment.  He would like to have his valve and all the enlarged aorta replaced to decrease the risk of aortic dissection.  He would like to use a mechanical valve and understands the need for lifelong anticoagulation.    I discussed the operative procedure with the patient and her husband including alternatives, benefits and risks; including but not limited to bleeding, blood transfusion, infection, stroke, myocardial infarction, graft failure, heart block requiring a permanent pacemaker, organ dysfunction, and death.  He understands and agrees to proceed.       Plan:   He will be scheduled for aortic valve replacement (mechanical valve) and replacement of ascending aorta (ie: Wheat procedure) using hypothermic circulatory arrest on Friday, 07/23/2024.   I spent 60 minutes performing this consultation and > 50% of this time was spent face to face counseling and coordinating the care of this patient's bicuspid aortic valve and ascending aortic aneurysm.   Con RAMAN Haile Toppins 07/15/2024 3:59 PM

## 2024-07-16 ENCOUNTER — Other Ambulatory Visit: Payer: Self-pay | Admitting: *Deleted

## 2024-07-16 ENCOUNTER — Encounter: Payer: Self-pay | Admitting: *Deleted

## 2024-07-16 DIAGNOSIS — I7121 Aneurysm of the ascending aorta, without rupture: Secondary | ICD-10-CM

## 2024-07-16 DIAGNOSIS — I350 Nonrheumatic aortic (valve) stenosis: Secondary | ICD-10-CM

## 2024-07-16 MED ORDER — ~~LOC~~ CARDIAC SURGERY, PATIENT & FAMILY EDUCATION: Freq: Once | Status: AC

## 2024-07-19 ENCOUNTER — Telehealth: Payer: Self-pay | Admitting: Emergency Medicine

## 2024-07-19 ENCOUNTER — Telehealth: Payer: Self-pay

## 2024-07-19 NOTE — Telephone Encounter (Signed)
 Called and spoke with the patient.  Patient is agreeable to following up with CT surgery post procedure on 9/19 and cancelling both of his follow up visits with Barnie Hila, NP on 9/23 and with Dr. Darliss on 9/30.  Both follow up appointments cancelled.

## 2024-07-19 NOTE — Telephone Encounter (Signed)
-----   Message from Barnie Hila sent at 07/17/2024 10:49 AM EDT ----- Gayla Schuller,   I just saw that this patient is scheduled for surgery on 9/19. We can cancel both his visits on 9/23 and 9/30. Will you contact him please? We will let CT surgery guide his follow up with us .   Thank you!  DW

## 2024-07-19 NOTE — Telephone Encounter (Signed)
 FMLA form completed and faxed to [employer] @ [phone]. Beginning LOA 07/23/24 through 10/18/24./ DOS 07/23/24

## 2024-07-21 ENCOUNTER — Ambulatory Visit: Admitting: Cardiology

## 2024-07-21 ENCOUNTER — Ambulatory Visit (HOSPITAL_COMMUNITY)

## 2024-07-21 ENCOUNTER — Other Ambulatory Visit (HOSPITAL_COMMUNITY)

## 2024-07-21 NOTE — Pre-Procedure Instructions (Signed)
 Surgical Instructions   Your procedure is scheduled on July 23, 2024. Report to [facility] Main Entrance A at 5:30 A.M., then check in with the Admitting office. Any questions or running late day of surgery: call [phone]  Questions prior to your surgery date: call [phone], Monday-Friday, 8am-4pm. If you experience any cold or flu symptoms such as cough, fever, chills, shortness of breath, etc. between now and your scheduled surgery, please notify us  at the above number.     Remember:  Do not eat or drink after midnight the night before your surgery. No mints, gum, or hard candy after midnight.    Take these medicines the morning of surgery with A SIP OF WATER : PARoxetine  (PAXIL )   May take these medicines IF NEEDED: omeprazole (PRILOSEC)  cyclobenzaprine  (FLEXERIL )   One week prior to surgery, STOP taking any Aspirin  (unless otherwise instructed by your surgeon) Aleve, Naproxen, Ibuprofen, Motrin, Advil, Goody's, BC's, all herbal medications, fish oil, and non-prescription vitamins.                     Do NOT Smoke (Tobacco/Vaping) for 24 hours prior to your procedure.  If you use a CPAP at night, you may bring your mask/headgear for your overnight stay.   You will be asked to remove any contacts, glasses, piercing's, hearing aid's, dentures/partials prior to surgery. Please bring cases for these items if needed.    Patients discharged the day of surgery will not be allowed to drive home, and someone needs to stay with them for 24 hours.  SURGICAL WAITING ROOM VISITATION Patients may have no more than 2 support people in the waiting area - these visitors may rotate.   Pre-op nurse will coordinate an appropriate time for 1 ADULT support person, who may not rotate, to accompany patient in pre-op.  Children under the age of 46 must have an adult with them who is not the patient and must remain in the main waiting area with an adult.  If the patient needs to stay  at the hospital during part of their recovery, the visitor guidelines for inpatient rooms apply.  Please refer to the [facility] website for the visitor guidelines for any additional information.   If you received a COVID test during your pre-op visit  it is requested that you wear a mask when out in public, stay away from anyone that may not be feeling well and notify your surgeon if you develop symptoms. If you have been in contact with anyone that has tested positive in the last 10 days please notify you surgeon.      Pre-operative CHG Bathing Instructions   You can play a key role in reducing the risk of infection after surgery. Your skin needs to be as free of germs as possible. You can reduce the number of germs on your skin by washing with CHG (chlorhexidine  gluconate) soap before surgery. CHG is an antiseptic soap that kills germs and continues to kill germs even after washing.   DO NOT use if you have an allergy to chlorhexidine /CHG or antibacterial soaps. If your skin becomes reddened or irritated, stop using the CHG and notify one of our RNs at [phone].              TAKE A SHOWER THE NIGHT BEFORE SURGERY AND THE DAY OF SURGERY    Please keep in mind the following:  DO NOT shave, including legs and underarms, 48 hours prior to surgery.  You may shave your face before/day of surgery.  Place clean sheets on your bed the night before surgery Use a clean washcloth (not used since being washed) for each shower. DO NOT sleep with pet's night before surgery.  CHG Shower Instructions:  Wash your face and private area with normal soap. If you choose to wash your hair, wash first with your normal shampoo.  After you use shampoo/soap, rinse your hair and body thoroughly to remove shampoo/soap residue.  Turn the water  OFF and apply half the bottle of CHG soap to a CLEAN washcloth.  Apply CHG soap ONLY FROM YOUR NECK DOWN TO YOUR TOES (washing for 3-5 minutes)  DO NOT use CHG soap  on face, private areas, open wounds, or sores.  Pay special attention to the area where your surgery is being performed.  If you are having back surgery, having someone wash your back for you may be helpful. Wait 2 minutes after CHG soap is applied, then you may rinse off the CHG soap.  Pat dry with a clean towel  Put on clean pajamas    Additional instructions for the day of surgery: DO NOT APPLY any lotions, deodorants, cologne, or perfumes.   Do not wear jewelry or makeup Do not wear nail polish, gel polish, artificial nails, or any other type of covering on natural nails (fingers and toes) Do not bring valuables to the hospital. [facility] is not responsible for valuables/personal belongings. Put on clean/comfortable clothes.  Please brush your teeth.  Ask your nurse before applying any prescription medications to the skin.

## 2024-07-22 ENCOUNTER — Other Ambulatory Visit: Payer: Self-pay

## 2024-07-22 ENCOUNTER — Ambulatory Visit (HOSPITAL_COMMUNITY)
Admission: RE | Admit: 2024-07-22 | Discharge: 2024-07-22 | Disposition: A | Discharge status: Home / Self Care | Source: Ambulatory Visit

## 2024-07-22 ENCOUNTER — Inpatient Hospital Stay (HOSPITAL_COMMUNITY)
Admission: RE | Admit: 2024-07-22 | Discharge: 2024-07-22 | Disposition: A | Discharge status: Home / Self Care | Source: Ambulatory Visit

## 2024-07-22 ENCOUNTER — Ambulatory Visit (HOSPITAL_BASED_OUTPATIENT_CLINIC_OR_DEPARTMENT_OTHER)
Admission: RE | Admit: 2024-07-22 | Discharge: 2024-07-22 | Disposition: A | Discharge status: Home / Self Care | Source: Ambulatory Visit | Attending: Surgery | Admitting: Surgery

## 2024-07-22 ENCOUNTER — Encounter (HOSPITAL_COMMUNITY): Payer: Self-pay

## 2024-07-22 VITALS — BP 131/87 | HR 70 | Temp 98.3°F | Resp 19 | Ht 71.0 in | Wt 292.0 lb

## 2024-07-22 DIAGNOSIS — I350 Nonrheumatic aortic (valve) stenosis: Secondary | ICD-10-CM | POA: Insufficient documentation

## 2024-07-22 DIAGNOSIS — I7121 Aneurysm of the ascending aorta, without rupture: Secondary | ICD-10-CM | POA: Insufficient documentation

## 2024-07-22 DIAGNOSIS — Z01818 Encounter for other preprocedural examination: Secondary | ICD-10-CM

## 2024-07-22 LAB — URINALYSIS, ROUTINE W REFLEX MICROSCOPIC
Bilirubin Urine: NEGATIVE
Glucose, UA: NEGATIVE mg/dL
Hgb urine dipstick: NEGATIVE
Ketones, ur: NEGATIVE mg/dL
Leukocytes,Ua: NEGATIVE
Nitrite: NEGATIVE
Protein, ur: NEGATIVE mg/dL
Specific Gravity, Urine: 1.018 (ref 1.005–1.030)
pH: 6 (ref 5.0–8.0)

## 2024-07-22 LAB — PROTIME-INR
INR: 1 (ref 0.8–1.2)
Prothrombin Time: 13.5 s (ref 11.4–15.2)

## 2024-07-22 LAB — CBC
HCT: 43.4 % (ref 39.0–52.0)
Hemoglobin: 14.5 g/dL (ref 13.0–17.0)
MCH: 27.9 pg (ref 26.0–34.0)
MCHC: 33.4 g/dL (ref 30.0–36.0)
MCV: 83.5 fL (ref 80.0–100.0)
Platelets: 295 K/uL (ref 150–400)
RBC: 5.2 MIL/uL (ref 4.22–5.81)
RDW: 13.2 % (ref 11.5–15.5)
WBC: 6.2 K/uL (ref 4.0–10.5)
nRBC: 0 % (ref 0.0–0.2)

## 2024-07-22 LAB — COMPREHENSIVE METABOLIC PANEL WITH GFR
ALT: 24 U/L (ref 0–44)
AST: 22 U/L (ref 15–41)
Albumin: 4.1 g/dL (ref 3.5–5.0)
Alkaline Phosphatase: 42 U/L (ref 38–126)
Anion gap: 14 (ref 5–15)
BUN: 14 mg/dL (ref 6–20)
CO2: 23 mmol/L (ref 22–32)
Calcium: 8.9 mg/dL (ref 8.9–10.3)
Chloride: 103 mmol/L (ref 98–111)
Creatinine, Ser: 0.83 mg/dL (ref 0.61–1.24)
GFR, Estimated: >60 mL/min (ref >60)
Glucose, Bld: 102 mg/dL — ABNORMAL HIGH (ref 70–99)
Potassium: 4.3 mmol/L (ref 3.5–5.1)
Sodium: 140 mmol/L (ref 135–145)
Total Bilirubin: 0.8 mg/dL (ref 0.0–1.2)
Total Protein: 7.3 g/dL (ref 6.5–8.1)

## 2024-07-22 LAB — TYPE AND SCREEN
ABO/RH(D): A NEG
Antibody Screen: NEGATIVE

## 2024-07-22 LAB — SURGICAL PCR SCREEN
MRSA, PCR: NEGATIVE
Staphylococcus aureus: NEGATIVE

## 2024-07-22 LAB — HEMOGLOBIN A1C
Hgb A1c MFr Bld: 4.8 % (ref 4.8–5.6)
Mean Plasma Glucose: 91.06 mg/dL

## 2024-07-22 LAB — APTT: aPTT: 34 s (ref 24–36)

## 2024-07-22 MED ORDER — TRANEXAMIC ACID (OHS) BOLUS VIA INFUSION
15.0000 mg/kg | INTRAVENOUS | Status: AC
  Administered 2024-07-23: 1987.5 mg via INTRAVENOUS
  Filled 2024-07-22: qty 1988

## 2024-07-22 MED ORDER — PLASMA-LYTE A IV SOLN
INTRAVENOUS | Status: DC
  Filled 2024-07-22: qty 2.5

## 2024-07-22 MED ORDER — MILRINONE LACTATE IN DEXTROSE 20-5 MG/100ML-% IV SOLN
0.3000 ug/kg/min | INTRAVENOUS | Status: DC
  Filled 2024-07-22: qty 100

## 2024-07-22 MED ORDER — POTASSIUM CHLORIDE 2 MEQ/ML IV SOLN
80.0000 meq | INTRAVENOUS | Status: DC
  Filled 2024-07-22: qty 40

## 2024-07-22 MED ORDER — CEFAZOLIN SODIUM-DEXTROSE 3-4 GM/150ML-% IV SOLN
3.0000 g | INTRAVENOUS | Status: AC
  Administered 2024-07-23: 3 g via INTRAVENOUS
  Filled 2024-07-22: qty 150

## 2024-07-22 MED ORDER — NOREPINEPHRINE 4 MG/250ML-% IV SOLN
0.0000 ug/min | INTRAVENOUS | Status: AC
Start: 2024-07-23 — End: 2024-07-23
  Administered 2024-07-23: 2 ug/min via INTRAVENOUS
  Filled 2024-07-22: qty 250

## 2024-07-22 MED ORDER — DEXMEDETOMIDINE HCL IN NACL 400 MCG/100ML IV SOLN
0.1000 ug/kg/h | INTRAVENOUS | Status: AC
  Administered 2024-07-23: .4 ug/kg/h via INTRAVENOUS
  Filled 2024-07-22: qty 100

## 2024-07-22 MED ORDER — MANNITOL 20 % IV SOLN
INTRAVENOUS | Status: DC
  Filled 2024-07-22: qty 13

## 2024-07-22 MED ORDER — CEFAZOLIN SODIUM-DEXTROSE 2-4 GM/100ML-% IV SOLN
2.0000 g | INTRAVENOUS | Status: AC
  Administered 2024-07-23: 2 g via INTRAVENOUS
  Filled 2024-07-22: qty 100

## 2024-07-22 MED ORDER — VANCOMYCIN HCL 1.5 G IV SOLR
1500.0000 mg | INTRAVENOUS | Status: AC
  Administered 2024-07-23: 1500 mg via INTRAVENOUS
  Filled 2024-07-22: qty 30

## 2024-07-22 MED ORDER — TRANEXAMIC ACID 1000 MG/10ML IV SOLN
1.5000 mg/kg/h | INTRAVENOUS | Status: AC
  Administered 2024-07-23 (×2): 1.5 mg/kg/h via INTRAVENOUS
  Filled 2024-07-22: qty 25

## 2024-07-22 MED ORDER — TRANEXAMIC ACID (OHS) PUMP PRIME SOLUTION
2.0000 mg/kg | INTRAVENOUS | Status: DC
  Filled 2024-07-22: qty 2.65

## 2024-07-22 MED ORDER — INSULIN REGULAR(HUMAN) IN NACL 100-0.9 UT/100ML-% IV SOLN
INTRAVENOUS | Status: AC
  Administered 2024-07-23: 1.2 [IU]/h via INTRAVENOUS
  Filled 2024-07-22: qty 100

## 2024-07-22 MED ORDER — NITROGLYCERIN IN D5W 200-5 MCG/ML-% IV SOLN
2.0000 ug/min | INTRAVENOUS | Status: DC
Start: 2024-07-23 — End: 2024-07-24
  Filled 2024-07-22: qty 250

## 2024-07-22 MED ORDER — EPINEPHRINE HCL 5 MG/250ML IV SOLN IN NS
0.0000 ug/min | INTRAVENOUS | Status: DC
Start: 2024-07-23 — End: 2024-07-23
  Filled 2024-07-22: qty 250

## 2024-07-22 MED ORDER — PHENYLEPHRINE HCL-NACL 20-0.9 MG/250ML-% IV SOLN
30.0000 ug/min | INTRAVENOUS | Status: DC
  Filled 2024-07-22: qty 250

## 2024-07-22 MED ORDER — HEPARIN 30,000 UNITS/1000 ML (OHS) CELLSAVER SOLUTION
Status: DC
  Filled 2024-07-22: qty 1000

## 2024-07-22 NOTE — Anesthesia Preprocedure Evaluation (Signed)
 Anesthesia Evaluation  Patient identified by MRN, date of birth, ID band Patient awake    Reviewed: Allergy & Precautions, NPO status , Patient's Chart, lab work & pertinent test results, reviewed documented beta blocker date and time   History of Anesthesia Complications Negative for: history of anesthetic complications  Airway Mallampati: III  TM Distance: >3 FB   Mouth opening: Limited Mouth Opening  Dental no notable dental hx.    Pulmonary neg COPD   breath sounds clear to auscultation       Cardiovascular hypertension, (-) CAD, (-) Past MI, (-) Cardiac Stents and (-) CABG + Valvular Problems/Murmurs AS  Rhythm:Regular Rate:Normal  IMPRESSIONS     1. Left ventricular ejection fraction, by estimation, is 55 to 60%. Left  ventricular ejection fraction by PLAX is 62 %. The left ventricle has  normal function. The left ventricle has no regional wall motion  abnormalities. There is moderate left  ventricular hypertrophy. Left ventricular diastolic parameters are  consistent with Grade I diastolic dysfunction (impaired relaxation).   2. Right ventricular systolic function is normal. The right ventricular  size is normal. Tricuspid regurgitation signal is inadequate for assessing  PA pressure.   3. Left atrial size was moderately dilated.   4. The mitral valve is normal in structure. Mild to moderate mitral valve  regurgitation. No evidence of mitral stenosis.   5. The aortic valve has an indeterminant number of cusps, consider  bicuspid valve. Aortic valve regurgitation is mild. Severe aortic valve  stenosis. Aortic valve area, by VTI measures 0.84 cm. Aortic valve mean  gradient measures 76.0 mmHg. Aortic valve   Vmax measures 5.98 m/s.   6. The inferior vena cava is normal in size with greater than 50%  respiratory variability, suggesting right atrial pressure of 3 mmHg.     Neuro/Psych neg Seizures PSYCHIATRIC DISORDERS  Anxiety Depression     Neuromuscular disease    GI/Hepatic ,GERD  ,,(+) neg Cirrhosis        Endo/Other    Renal/GU Renal disease     Musculoskeletal   Abdominal   Peds  Hematology   Anesthesia Other Findings   Reproductive/Obstetrics                              Anesthesia Physical Anesthesia Plan  ASA: 4  Anesthesia Plan: General   Post-op Pain Management:    Induction: Intravenous  PONV Risk Score and Plan: 2  Airway Management Planned: Oral ETT and Video Laryngoscope Planned  Additional Equipment: Arterial line, Ultrasound Guidance Line Placement and ClearSight  Intra-op Plan:   Post-operative Plan: Post-operative intubation/ventilation  Informed Consent: I have reviewed the patients History and Physical, chart, labs and discussed the procedure including the risks, benefits and alternatives for the proposed anesthesia with the patient or authorized representative who has indicated his/her understanding and acceptance.     Dental advisory given  Plan Discussed with: CRNA  Anesthesia Plan Comments: (PAT note written 07/22/2024 by Ulyssa Walthour, PA-C.  )         Anesthesia Quick Evaluation

## 2024-07-22 NOTE — Progress Notes (Signed)
 PCP - Dr. [employer] Cardiologist - Dr. Redell Cave  PPM/ICD - Denies Device Orders - n/a Rep Notified - n/a  Chest x-ray - 07-22-24 EKG - 07-02-24 Stress Test - 09-07-13 ECHO - 07-02-24 Cardiac Cath - 07-09-24  Sleep Study - Denies CPAP - n/a  NON-diabetic  Last dose of GLP1 agonist-  Denies GLP1 instructions: n/a  Blood Thinner Instructions: Denies Aspirin  Instructions:Denies  ERAS Protcol - NPO  PRE-SURGERY Ensure or G2- n/a  COVID TEST- n/a   Anesthesia review: Yes, HTN, bicuspid aortic valve, obesity, systolic murmur  Patient denies shortness of breath, fever, cough and chest pain at PAT appointment. Patient denies any respiratory issues at this time.    All instructions explained to the patient, with a verbal understanding of the material. Patient agrees to go over the instructions while at home for a better understanding. Patient also instructed to self quarantine after being tested for COVID-19. The opportunity to ask questions was provided.

## 2024-07-22 NOTE — Progress Notes (Signed)
 Anesthesia Chart Review:  Case: [id number] Date/Time: 07/23/24 0715   Procedures:      REPLACEMENT, AORTIC VALVE, OPEN     REPLACEMENT, AORTA, ASCENDING - CIRC ARREST     ECHOCARDIOGRAM, TRANSESOPHAGEAL, INTRAOPERATIVE   Anesthesia type: General   Diagnosis:      Aortic valve stenosis, etiology of cardiac valve disease unspecified [I35.0]     Aneurysm of ascending aorta without rupture (HCC) [I71.21]   Pre-op diagnosis:      AORTIC STENOSIS     THORACIC AORTIC ANEURYSM   [facility]OM [facility] / [facility]   Surgeons: Daniel Con RAMAN, MD       DISCUSSION: Patient is a 58 year old male scheduled for the above procedure. Recent cardiology evaluation for leg swelling and history of murmur. TTE in August showed normal biventricular function, severe AS, mild AR, mild-moderate MR. No significant CAD by 07/2024 cath. He was referred to CT surgery. CTA chest/abd/pelvis also ordered, although radiology report is still pending. Dr. Dr. Linward 9/11/205 consult note, The quality of his CTA chest is poor with motion artifact, but on my own measurement, as well as that of a cardiology imager, the aorta measures at least 4.3 x 4.4 cm. Guidelines recommend aortic replacement for patients with bicuspid valve having surgery with an aneurysm < 4.5 cm. Given his young age and desire to avoid a second heart operation, we will replace his ascending aorta at the time of valve replacement.  History includes never smoker, HTN, murmur (functionally bicuspid AV with severe AS, mild MR; mild-moderate MR 06/2024 TTE), melanoma (s/p excision, LUE). BMI is consistent with morbid obesity.   Anesthesia team to evaluate on the day of surgery.    VS: BP 131/87   Pulse 70   Temp 36.8 C   Resp 19   Ht 5' 11 (1.803 m)   Wt 132.5 kg   SpO2 97%   BMI 40.73 kg/m   PROVIDERS: Kip Righter, MD is PCP  Darliss Rogue, MD is cardiologist    LABS: Labs reviewed: Acceptable for surgery. (all labs ordered are listed, but only  abnormal results are displayed)  Labs Reviewed  COMPREHENSIVE METABOLIC PANEL WITH GFR - Abnormal; Notable for the following components:      Result Value   Glucose, Bld 102 (*)    All other components within normal limits  SURGICAL PCR SCREEN  CBC  PROTIME-INR  APTT  URINALYSIS, ROUTINE W REFLEX MICROSCOPIC  HEMOGLOBIN A1C  TYPE AND SCREEN    IMAGES: CXR 07/22/2024: FINDINGS: The heart size and mediastinal contours are within normal limits. Both lungs are clear. The visualized skeletal structures are unremarkable. IMPRESSION: No active cardiopulmonary disease.   CTA Chest/abd/pelvis 07/14/2024: Report in process.    EKG: 07/02/2024:  Normal sinus rhythm Left axis deviation Left ventricular hypertrophy with QRS widening and repolarization abnormality ( R in aVL , Cornell product , Romhilt-Estes ) When compared with ECG of 31-May-2024 09:24, No significant change was found Confirmed by Loistine Sober [phone]) on 07/02/2024 4:38:15 PM   CV: US  Carotid 07/22/2024: Summary:  - Right Carotid: The extracranial vessels were near-normal with only minimal wall thickening or plaque.  - Left Carotid: The extracranial vessels were near-normal with only minimal wall thickening or plaque.  - Vertebrals:  Bilateral vertebral arteries demonstrate antegrade flow.  - Subclavians: Normal flow hemodynamics were seen in bilateral subclavian arteries.    RHC/LHC 07/09/2024: Conclusions: No angiographically significant coronary artery disease. Mildly elevated left heart, right heart, and pulmonary artery pressures.  Low normal to mildly reduced Fick cardiac output/index.   Recommendations: Proceed with cardiac surgery consultation for further management of severe aortic valve stenosis. Primary prevention of coronary artery disease. Sodium restriction and moderation of fluid intake; defer addition of standing diuretic in the setting of severe aortic stenosis.  Echo  07/02/2024: IMPRESSIONS   1. Left ventricular ejection fraction, by estimation, is 55 to 60%. Left  ventricular ejection fraction by PLAX is 62 %. The left ventricle has  normal function. The left ventricle has no regional wall motion  abnormalities. There is moderate left  ventricular hypertrophy. Left ventricular diastolic parameters are  consistent with Grade I diastolic dysfunction (impaired relaxation).   2. Right ventricular systolic function is normal. The right ventricular  size is normal. Tricuspid regurgitation signal is inadequate for assessing  PA pressure.   3. Left atrial size was moderately dilated.   4. The mitral valve is normal in structure. Mild to moderate mitral valve  regurgitation. No evidence of mitral stenosis.   5. The aortic valve has an indeterminant number of cusps, consider  bicuspid valve. Aortic valve regurgitation is mild. Severe aortic valve  stenosis. Aortic valve area, by VTI measures 0.84 cm. Aortic valve mean  gradient measures 76.0 mmHg. Aortic valve   Vmax measures 5.98 m/s.   6. The inferior vena cava is normal in size with greater than 50%  respiratory variability, suggesting right atrial pressure of 3 mmHg.   Past Medical History:  Diagnosis Date   Carpal tunnel syndrome    Chest pain    Depression    Heart murmur    Hemorrhoids    HTN (hypertension)    Obesity    T wave inversion in EKG     Past Surgical History:  Procedure Laterality Date   MELANOMA EXCISION     left upper arm   RIGHT HEART CATH AND CORONARY ANGIOGRAPHY Bilateral 07/09/2024   Procedure: RIGHT HEART CATH AND CORONARY ANGIOGRAPHY;  Surgeon: Mady Bruckner, MD;  Location: [facility] INVASIVE CV LAB;  Service: Cardiovascular;  Laterality: Bilateral;    MEDICATIONS:  cyclobenzaprine  (FLEXERIL ) 10 MG tablet   omeprazole (PRILOSEC) 20 MG capsule   PARoxetine  (PAXIL ) 10 MG tablet   No current facility-administered medications for this encounter.    [START ON 07/23/2024]  ceFAZolin  (ANCEF ) IVPB 2g/100 mL premix   [START ON 07/23/2024] ceFAZolin  (ANCEF ) IVPB 3g/150 mL premix   [facility] Patient [facility] Family Education   [START ON 07/23/2024] dexmedetomidine  (PRECEDEX ) 400 MCG/100ML (4 mcg/mL) infusion   [START ON 07/23/2024] EPINEPHrine  (ADRENALIN ) 5 mg in NS 250 mL (0.02 mg/mL) premix infusion   [START ON 07/23/2024] heparin  30,000 units/NS 1000 mL solution for CELLSAVER   [START ON 07/23/2024] heparin  sodium (porcine) 2,500 Units, papaverine 30 mg in electrolyte-A (PLASMALYTE-A PH 7.4) 500 mL irrigation   [START ON 07/23/2024] insulin  regular, human (MYXREDLIN ) 100 units/ 100 mL infusion   [START ON 07/23/2024] [facility] Blood Cardioplegia vial (lidocaine /magnesium /mannitol  0.26g-4g-6.4g)   [START ON 07/23/2024] milrinone  (PRIMACOR ) 20 MG/100 ML (0.2 mg/mL) infusion   [START ON 07/23/2024] nitroGLYCERIN  50 mg in dextrose  5 % 250 mL (0.2 mg/mL) infusion   [START ON 07/23/2024] norepinephrine  (LEVOPHED ) 4mg  in (0.016 mg/mL) premix infusion   [START ON 07/23/2024] phenylephrine  (NEO-SYNEPHRINE) 20mg /NS 250mL premix infusion   [START ON 07/23/2024] potassium chloride  injection 80 mEq   [START ON 07/23/2024] tranexamic acid  (CYKLOKAPRON ) 2,500 mg in sodium chloride  0.9 % 250 mL (10 mg/mL) infusion   [START ON 07/23/2024] tranexamic acid  (  CYKLOKAPRON ) bolus via infusion - over 30 minutes 1,987.5 mg   [START ON 07/23/2024] tranexamic acid  (CYKLOKAPRON ) pump prime solution 265 mg   [START ON 07/23/2024] Vancomycin  (VANCOCIN ) 1,500 mg in sodium chloride  0.9 % 500 mL IVPB    Isaiah Ruder, PA-C Surgical Short Stay/Anesthesiology [facility] Phone [phone] [facility] Phone [phone] 07/22/2024 12:45 PM

## 2024-07-23 ENCOUNTER — Encounter (HOSPITAL_COMMUNITY): Payer: Self-pay

## 2024-07-23 ENCOUNTER — Other Ambulatory Visit: Payer: Self-pay

## 2024-07-23 ENCOUNTER — Inpatient Hospital Stay (HOSPITAL_COMMUNITY): Admission: RE | Disposition: A | Discharge status: Home Health | Payer: Self-pay | Source: Home / Self Care

## 2024-07-23 ENCOUNTER — Inpatient Hospital Stay (HOSPITAL_COMMUNITY)

## 2024-07-23 ENCOUNTER — Inpatient Hospital Stay (HOSPITAL_COMMUNITY): Admission: RE | Admit: 2024-07-23 | Discharge: 2024-07-28 | DRG: 220 | Disposition: A | Discharge status: Home Health

## 2024-07-23 ENCOUNTER — Inpatient Hospital Stay (HOSPITAL_COMMUNITY): Payer: Self-pay | Admitting: Anesthesiology

## 2024-07-23 DIAGNOSIS — F32A Depression, unspecified: Secondary | ICD-10-CM | POA: Diagnosis present

## 2024-07-23 DIAGNOSIS — E66813 Obesity, class 3: Secondary | ICD-10-CM | POA: Diagnosis present

## 2024-07-23 DIAGNOSIS — I10 Essential (primary) hypertension: Secondary | ICD-10-CM | POA: Diagnosis present

## 2024-07-23 DIAGNOSIS — Z7901 Long term (current) use of anticoagulants: Secondary | ICD-10-CM | POA: Diagnosis not present

## 2024-07-23 DIAGNOSIS — Z8249 Family history of ischemic heart disease and other diseases of the circulatory system: Secondary | ICD-10-CM | POA: Diagnosis not present

## 2024-07-23 DIAGNOSIS — Z952 Presence of prosthetic heart valve: Principal | ICD-10-CM

## 2024-07-23 DIAGNOSIS — Z01818 Encounter for other preprocedural examination: Secondary | ICD-10-CM

## 2024-07-23 DIAGNOSIS — I471 Supraventricular tachycardia, unspecified: Secondary | ICD-10-CM | POA: Diagnosis present

## 2024-07-23 DIAGNOSIS — D696 Thrombocytopenia, unspecified: Secondary | ICD-10-CM | POA: Diagnosis not present

## 2024-07-23 DIAGNOSIS — Q2381 Bicuspid aortic valve: Secondary | ICD-10-CM | POA: Diagnosis not present

## 2024-07-23 DIAGNOSIS — Z79899 Other long term (current) drug therapy: Secondary | ICD-10-CM | POA: Diagnosis not present

## 2024-07-23 DIAGNOSIS — Z833 Family history of diabetes mellitus: Secondary | ICD-10-CM | POA: Diagnosis not present

## 2024-07-23 DIAGNOSIS — I350 Nonrheumatic aortic (valve) stenosis: Secondary | ICD-10-CM

## 2024-07-23 DIAGNOSIS — D62 Acute posthemorrhagic anemia: Secondary | ICD-10-CM | POA: Diagnosis not present

## 2024-07-23 DIAGNOSIS — F418 Other specified anxiety disorders: Secondary | ICD-10-CM

## 2024-07-23 DIAGNOSIS — Z85820 Personal history of malignant melanoma of skin: Secondary | ICD-10-CM

## 2024-07-23 DIAGNOSIS — I7121 Aneurysm of the ascending aorta, without rupture: Secondary | ICD-10-CM | POA: Diagnosis present

## 2024-07-23 DIAGNOSIS — K219 Gastro-esophageal reflux disease without esophagitis: Secondary | ICD-10-CM | POA: Diagnosis present

## 2024-07-23 DIAGNOSIS — Z6841 Body Mass Index (BMI) 40.0 and over, adult: Secondary | ICD-10-CM

## 2024-07-23 DIAGNOSIS — G5600 Carpal tunnel syndrome, unspecified upper limb: Secondary | ICD-10-CM | POA: Diagnosis present

## 2024-07-23 DIAGNOSIS — I712 Thoracic aortic aneurysm, without rupture, unspecified: Secondary | ICD-10-CM

## 2024-07-23 DIAGNOSIS — F419 Anxiety disorder, unspecified: Secondary | ICD-10-CM | POA: Diagnosis present

## 2024-07-23 HISTORY — PX: AORTIC VALVE REPLACEMENT: SHX41

## 2024-07-23 HISTORY — PX: REPLACEMENT ASCENDING AORTA: SHX6068

## 2024-07-23 HISTORY — PX: INTRAOPERATIVE TRANSESOPHAGEAL ECHOCARDIOGRAM: SHX5062

## 2024-07-23 HISTORY — DX: Presence of prosthetic heart valve: Z95.2

## 2024-07-23 LAB — POCT I-STAT, CHEM 8
BUN: 14 mg/dL (ref 6–20)
BUN: 13 mg/dL (ref 6–20)
BUN: 13 mg/dL (ref 6–20)
BUN: 12 mg/dL (ref 6–20)
BUN: 13 mg/dL (ref 6–20)
BUN: 12 mg/dL (ref 6–20)
Calcium, Ion: 1.26 mmol/L (ref 1.15–1.40)
Calcium, Ion: 1.18 mmol/L (ref 1.15–1.40)
Calcium, Ion: 1.07 mmol/L — ABNORMAL LOW (ref 1.15–1.40)
Calcium, Ion: 0.96 mmol/L — ABNORMAL LOW (ref 1.15–1.40)
Calcium, Ion: 0.99 mmol/L — ABNORMAL LOW (ref 1.15–1.40)
Calcium, Ion: 1 mmol/L — ABNORMAL LOW (ref 1.15–1.40)
Chloride: 104 mmol/L (ref 98–111)
Chloride: 104 mmol/L (ref 98–111)
Chloride: 102 mmol/L (ref 98–111)
Chloride: 103 mmol/L (ref 98–111)
Chloride: 102 mmol/L (ref 98–111)
Chloride: 104 mmol/L (ref 98–111)
Creatinine, Ser: 0.9 mg/dL (ref 0.61–1.24)
Creatinine, Ser: 0.9 mg/dL (ref 0.61–1.24)
Creatinine, Ser: 1 mg/dL (ref 0.61–1.24)
Creatinine, Ser: 0.9 mg/dL (ref 0.61–1.24)
Creatinine, Ser: 0.8 mg/dL (ref 0.61–1.24)
Creatinine, Ser: 0.7 mg/dL (ref 0.61–1.24)
Glucose, Bld: 117 mg/dL — ABNORMAL HIGH (ref 70–99)
Glucose, Bld: 152 mg/dL — ABNORMAL HIGH (ref 70–99)
Glucose, Bld: 135 mg/dL — ABNORMAL HIGH (ref 70–99)
Glucose, Bld: 154 mg/dL — ABNORMAL HIGH (ref 70–99)
Glucose, Bld: 152 mg/dL — ABNORMAL HIGH (ref 70–99)
Glucose, Bld: 159 mg/dL — ABNORMAL HIGH (ref 70–99)
HCT: 39 % (ref 39.0–52.0)
HCT: 36 % — ABNORMAL LOW (ref 39.0–52.0)
HCT: 30 % — ABNORMAL LOW (ref 39.0–52.0)
HCT: 29 % — ABNORMAL LOW (ref 39.0–52.0)
HCT: 30 % — ABNORMAL LOW (ref 39.0–52.0)
HCT: 30 % — ABNORMAL LOW (ref 39.0–52.0)
Hemoglobin: 13.3 g/dL (ref 13.0–17.0)
Hemoglobin: 12.2 g/dL — ABNORMAL LOW (ref 13.0–17.0)
Hemoglobin: 10.2 g/dL — ABNORMAL LOW (ref 13.0–17.0)
Hemoglobin: 9.9 g/dL — ABNORMAL LOW (ref 13.0–17.0)
Hemoglobin: 10.2 g/dL — ABNORMAL LOW (ref 13.0–17.0)
Hemoglobin: 10.2 g/dL — ABNORMAL LOW (ref 13.0–17.0)
Potassium: 4 mmol/L (ref 3.5–5.1)
Potassium: 4.1 mmol/L (ref 3.5–5.1)
Potassium: 4.1 mmol/L (ref 3.5–5.1)
Potassium: 4.6 mmol/L (ref 3.5–5.1)
Potassium: 4.9 mmol/L (ref 3.5–5.1)
Potassium: 4.4 mmol/L (ref 3.5–5.1)
Sodium: 140 mmol/L (ref 135–145)
Sodium: 138 mmol/L (ref 135–145)
Sodium: 136 mmol/L (ref 135–145)
Sodium: 138 mmol/L (ref 135–145)
Sodium: 138 mmol/L (ref 135–145)
Sodium: 139 mmol/L (ref 135–145)
TCO2: 26 mmol/L (ref 22–32)
TCO2: 24 mmol/L (ref 22–32)
TCO2: 25 mmol/L (ref 22–32)
TCO2: 23 mmol/L (ref 22–32)
TCO2: 25 mmol/L (ref 22–32)
TCO2: 24 mmol/L (ref 22–32)

## 2024-07-23 LAB — POCT I-STAT 7, (LYTES, BLD GAS, ICA,H+H)
Acid-Base Excess: 0 mmol/L (ref 0.0–2.0)
Acid-Base Excess: 0 mmol/L (ref 0.0–2.0)
Acid-base deficit: 4 mmol/L — ABNORMAL HIGH (ref 0.0–2.0)
Acid-base deficit: 2 mmol/L (ref 0.0–2.0)
Acid-base deficit: 1 mmol/L (ref 0.0–2.0)
Acid-base deficit: 1 mmol/L (ref 0.0–2.0)
Acid-base deficit: 4 mmol/L — ABNORMAL HIGH (ref 0.0–2.0)
Acid-base deficit: 4 mmol/L — ABNORMAL HIGH (ref 0.0–2.0)
Acid-base deficit: 4 mmol/L — ABNORMAL HIGH (ref 0.0–2.0)
Bicarbonate: 26.4 mmol/L (ref 20.0–28.0)
Bicarbonate: 26.2 mmol/L (ref 20.0–28.0)
Bicarbonate: 24.3 mmol/L (ref 20.0–28.0)
Bicarbonate: 23.7 mmol/L (ref 20.0–28.0)
Bicarbonate: 24.1 mmol/L (ref 20.0–28.0)
Bicarbonate: 23 mmol/L (ref 20.0–28.0)
Bicarbonate: 21.8 mmol/L (ref 20.0–28.0)
Bicarbonate: 21.8 mmol/L (ref 20.0–28.0)
Bicarbonate: 21.3 mmol/L (ref 20.0–28.0)
Calcium, Ion: 1.2 mmol/L (ref 1.15–1.40)
Calcium, Ion: 1.06 mmol/L — ABNORMAL LOW (ref 1.15–1.40)
Calcium, Ion: 1.07 mmol/L — ABNORMAL LOW (ref 1.15–1.40)
Calcium, Ion: 1.03 mmol/L — ABNORMAL LOW (ref 1.15–1.40)
Calcium, Ion: 1 mmol/L — ABNORMAL LOW (ref 1.15–1.40)
Calcium, Ion: 0.98 mmol/L — ABNORMAL LOW (ref 1.15–1.40)
Calcium, Ion: 1.04 mmol/L — ABNORMAL LOW (ref 1.15–1.40)
Calcium, Ion: 1.04 mmol/L — ABNORMAL LOW (ref 1.15–1.40)
Calcium, Ion: 1.05 mmol/L — ABNORMAL LOW (ref 1.15–1.40)
HCT: 39 % (ref 39.0–52.0)
HCT: 29 % — ABNORMAL LOW (ref 39.0–52.0)
HCT: 32 % — ABNORMAL LOW (ref 39.0–52.0)
HCT: 32 % — ABNORMAL LOW (ref 39.0–52.0)
HCT: 31 % — ABNORMAL LOW (ref 39.0–52.0)
HCT: 30 % — ABNORMAL LOW (ref 39.0–52.0)
HCT: 30 % — ABNORMAL LOW (ref 39.0–52.0)
HCT: 28 % — ABNORMAL LOW (ref 39.0–52.0)
HCT: 29 % — ABNORMAL LOW (ref 39.0–52.0)
Hemoglobin: 13.3 g/dL (ref 13.0–17.0)
Hemoglobin: 9.9 g/dL — ABNORMAL LOW (ref 13.0–17.0)
Hemoglobin: 10.9 g/dL — ABNORMAL LOW (ref 13.0–17.0)
Hemoglobin: 10.9 g/dL — ABNORMAL LOW (ref 13.0–17.0)
Hemoglobin: 10.5 g/dL — ABNORMAL LOW (ref 13.0–17.0)
Hemoglobin: 10.2 g/dL — ABNORMAL LOW (ref 13.0–17.0)
Hemoglobin: 10.2 g/dL — ABNORMAL LOW (ref 13.0–17.0)
Hemoglobin: 9.5 g/dL — ABNORMAL LOW (ref 13.0–17.0)
Hemoglobin: 9.9 g/dL — ABNORMAL LOW (ref 13.0–17.0)
O2 Saturation: 100 %
O2 Saturation: 100 %
O2 Saturation: 100 %
O2 Saturation: 100 %
O2 Saturation: 100 %
O2 Saturation: 100 %
O2 Saturation: 95 %
O2 Saturation: 94 %
O2 Saturation: 94 %
Patient temperature: 35.9
Patient temperature: 37.2
Patient temperature: 37.5
Potassium: 4.1 mmol/L (ref 3.5–5.1)
Potassium: 3.8 mmol/L (ref 3.5–5.1)
Potassium: 4.1 mmol/L (ref 3.5–5.1)
Potassium: 3.9 mmol/L (ref 3.5–5.1)
Potassium: 4.4 mmol/L (ref 3.5–5.1)
Potassium: 4.4 mmol/L (ref 3.5–5.1)
Potassium: 4.3 mmol/L (ref 3.5–5.1)
Potassium: 4.8 mmol/L (ref 3.5–5.1)
Potassium: 5.2 mmol/L — ABNORMAL HIGH (ref 3.5–5.1)
Sodium: 140 mmol/L (ref 135–145)
Sodium: 138 mmol/L (ref 135–145)
Sodium: 137 mmol/L (ref 135–145)
Sodium: 138 mmol/L (ref 135–145)
Sodium: 138 mmol/L (ref 135–145)
Sodium: 138 mmol/L (ref 135–145)
Sodium: 140 mmol/L (ref 135–145)
Sodium: 141 mmol/L (ref 135–145)
Sodium: 138 mmol/L (ref 135–145)
TCO2: 28 mmol/L (ref 22–32)
TCO2: 28 mmol/L (ref 22–32)
TCO2: 26 mmol/L (ref 22–32)
TCO2: 25 mmol/L (ref 22–32)
TCO2: 25 mmol/L (ref 22–32)
TCO2: 24 mmol/L (ref 22–32)
TCO2: 23 mmol/L (ref 22–32)
TCO2: 23 mmol/L (ref 22–32)
TCO2: 22 mmol/L (ref 22–32)
pCO2 arterial: 49.7 mmHg — ABNORMAL HIGH (ref 32–48)
pCO2 arterial: 49.7 mmHg — ABNORMAL HIGH (ref 32–48)
pCO2 arterial: 57.3 mmHg — ABNORMAL HIGH (ref 32–48)
pCO2 arterial: 41.3 mmHg (ref 32–48)
pCO2 arterial: 38.3 mmHg (ref 32–48)
pCO2 arterial: 35.6 mmHg (ref 32–48)
pCO2 arterial: 38.6 mmHg (ref 32–48)
pCO2 arterial: 42.9 mmHg (ref 32–48)
pCO2 arterial: 40.3 mmHg (ref 32–48)
pH, Arterial: 7.333 — ABNORMAL LOW (ref 7.35–7.45)
pH, Arterial: 7.329 — ABNORMAL LOW (ref 7.35–7.45)
pH, Arterial: 7.235 — ABNORMAL LOW (ref 7.35–7.45)
pH, Arterial: 7.367 (ref 7.35–7.45)
pH, Arterial: 7.406 (ref 7.35–7.45)
pH, Arterial: 7.419 (ref 7.35–7.45)
pH, Arterial: 7.355 (ref 7.35–7.45)
pH, Arterial: 7.316 — ABNORMAL LOW (ref 7.35–7.45)
pH, Arterial: 7.333 — ABNORMAL LOW (ref 7.35–7.45)
pO2, Arterial: 396 mmHg — ABNORMAL HIGH (ref 83–108)
pO2, Arterial: 399 mmHg — ABNORMAL HIGH (ref 83–108)
pO2, Arterial: 248 mmHg — ABNORMAL HIGH (ref 83–108)
pO2, Arterial: 314 mmHg — ABNORMAL HIGH (ref 83–108)
pO2, Arterial: 267 mmHg — ABNORMAL HIGH (ref 83–108)
pO2, Arterial: 378 mmHg — ABNORMAL HIGH (ref 83–108)
pO2, Arterial: 76 mmHg — ABNORMAL LOW (ref 83–108)
pO2, Arterial: 79 mmHg — ABNORMAL LOW (ref 83–108)
pO2, Arterial: 79 mmHg — ABNORMAL LOW (ref 83–108)

## 2024-07-23 LAB — BASIC METABOLIC PANEL WITH GFR
Anion gap: 7 (ref 5–15)
BUN: 14 mg/dL (ref 6–20)
CO2: 21 mmol/L — ABNORMAL LOW (ref 22–32)
Calcium: 7.1 mg/dL — ABNORMAL LOW (ref 8.9–10.3)
Chloride: 110 mmol/L (ref 98–111)
Creatinine, Ser: 1.01 mg/dL (ref 0.61–1.24)
GFR, Estimated: >60 mL/min (ref >60)
Glucose, Bld: 152 mg/dL — ABNORMAL HIGH (ref 70–99)
Potassium: 4.8 mmol/L (ref 3.5–5.1)
Sodium: 138 mmol/L (ref 135–145)

## 2024-07-23 LAB — POCT I-STAT EG7
Acid-base deficit: 1 mmol/L (ref 0.0–2.0)
Bicarbonate: 25.3 mmol/L (ref 20.0–28.0)
Calcium, Ion: 1.04 mmol/L — ABNORMAL LOW (ref 1.15–1.40)
HCT: 29 % — ABNORMAL LOW (ref 39.0–52.0)
Hemoglobin: 9.9 g/dL — ABNORMAL LOW (ref 13.0–17.0)
O2 Saturation: 90 %
Potassium: 3.9 mmol/L (ref 3.5–5.1)
Sodium: 137 mmol/L (ref 135–145)
TCO2: 27 mmol/L (ref 22–32)
pCO2, Ven: 49.8 mmHg (ref 44–60)
pH, Ven: 7.313 (ref 7.25–7.43)
pO2, Ven: 64 mmHg — ABNORMAL HIGH (ref 32–45)

## 2024-07-23 LAB — CBC
HCT: 32.2 % — ABNORMAL LOW (ref 39.0–52.0)
HCT: 30.5 % — ABNORMAL LOW (ref 39.0–52.0)
Hemoglobin: 10.7 g/dL — ABNORMAL LOW (ref 13.0–17.0)
Hemoglobin: 9.8 g/dL — ABNORMAL LOW (ref 13.0–17.0)
MCH: 27.9 pg (ref 26.0–34.0)
MCH: 27.3 pg (ref 26.0–34.0)
MCHC: 33.2 g/dL (ref 30.0–36.0)
MCHC: 32.1 g/dL (ref 30.0–36.0)
MCV: 83.9 fL (ref 80.0–100.0)
MCV: 85 fL (ref 80.0–100.0)
Platelets: 167 K/uL (ref 150–400)
Platelets: 153 K/uL (ref 150–400)
RBC: 3.84 MIL/uL — ABNORMAL LOW (ref 4.22–5.81)
RBC: 3.59 MIL/uL — ABNORMAL LOW (ref 4.22–5.81)
RDW: 13.4 % (ref 11.5–15.5)
RDW: 13.5 % (ref 11.5–15.5)
WBC: 15.4 K/uL — ABNORMAL HIGH (ref 4.0–10.5)
WBC: 9.5 K/uL (ref 4.0–10.5)
nRBC: 0 % (ref 0.0–0.2)
nRBC: 0 % (ref 0.0–0.2)

## 2024-07-23 LAB — GLUCOSE, CAPILLARY
Glucose-Capillary: 125 mg/dL — ABNORMAL HIGH (ref 70–99)
Glucose-Capillary: 136 mg/dL — ABNORMAL HIGH (ref 70–99)
Glucose-Capillary: 137 mg/dL — ABNORMAL HIGH (ref 70–99)
Glucose-Capillary: 157 mg/dL — ABNORMAL HIGH (ref 70–99)
Glucose-Capillary: 150 mg/dL — ABNORMAL HIGH (ref 70–99)
Glucose-Capillary: 149 mg/dL — ABNORMAL HIGH (ref 70–99)

## 2024-07-23 LAB — ECHO INTRAOPERATIVE TEE
Height: 71 in
Weight: 4673.75 [oz_av]

## 2024-07-23 LAB — FIBRINOGEN: Fibrinogen: 338 mg/dL (ref 210–475)

## 2024-07-23 LAB — PROTIME-INR
INR: 1.4 — ABNORMAL HIGH (ref 0.8–1.2)
Prothrombin Time: 17.4 s — ABNORMAL HIGH (ref 11.4–15.2)

## 2024-07-23 LAB — ABO/RH: ABO/RH(D): A NEG

## 2024-07-23 LAB — HEMOGLOBIN AND HEMATOCRIT, BLOOD
HCT: 33 % — ABNORMAL LOW (ref 39.0–52.0)
Hemoglobin: 10.8 g/dL — ABNORMAL LOW (ref 13.0–17.0)

## 2024-07-23 LAB — MAGNESIUM: Magnesium: 3.4 mg/dL — ABNORMAL HIGH (ref 1.7–2.4)

## 2024-07-23 LAB — PLATELET COUNT: Platelets: 195 K/uL (ref 150–400)

## 2024-07-23 LAB — APTT: aPTT: 34 s (ref 24–36)

## 2024-07-23 SURGERY — REPLACEMENT, AORTIC VALVE, OPEN
Anesthesia: General

## 2024-07-23 MED ORDER — MIDAZOLAM HCL 2 MG/2ML IJ SOLN: 2.0000 mg | INTRAMUSCULAR | Status: DC | PRN

## 2024-07-23 MED ORDER — SODIUM CHLORIDE 0.9 % IV SOLN: INTRAVENOUS | Status: AC

## 2024-07-23 MED ORDER — SUGAMMADEX SODIUM 200 MG/2ML IV SOLN
INTRAVENOUS | Status: DC | PRN
Start: 2024-07-23 — End: 2024-07-23
  Administered 2024-07-23: 300 mg via INTRAVENOUS

## 2024-07-23 MED ORDER — METOCLOPRAMIDE HCL 5 MG/ML IJ SOLN
10.0000 mg | Freq: Four times a day (QID) | INTRAMUSCULAR | Status: AC
  Administered 2024-07-23 – 2024-07-25 (×5): 10 mg via INTRAVENOUS
  Filled 2024-07-23 (×5): qty 2

## 2024-07-23 MED ORDER — ALBUMIN HUMAN 5 % IV SOLN: INTRAVENOUS | Status: DC | PRN

## 2024-07-23 MED ORDER — PROPOFOL 10 MG/ML IV BOLUS
INTRAVENOUS | Status: DC | PRN
  Administered 2024-07-23: 100 mg via INTRAVENOUS
  Administered 2024-07-23: 50 mg via INTRAVENOUS
  Administered 2024-07-23: 200 mg via INTRAVENOUS
  Administered 2024-07-23: 20 mg via INTRAVENOUS

## 2024-07-23 MED ORDER — FENTANYL CITRATE (PF) 250 MCG/5ML IJ SOLN
INTRAMUSCULAR | Status: AC
  Filled 2024-07-23: qty 5

## 2024-07-23 MED ORDER — PROTAMINE SULFATE 10 MG/ML IV SOLN
INTRAVENOUS | Status: DC | PRN
  Administered 2024-07-23: 20 mg via INTRAVENOUS
  Administered 2024-07-23: 330 mg via INTRAVENOUS

## 2024-07-23 MED ORDER — LACTATED RINGERS IV SOLN: INTRAVENOUS | Status: DC | PRN

## 2024-07-23 MED ORDER — SODIUM CHLORIDE 0.9% FLUSH
3.0000 mL | Freq: Two times a day (BID) | INTRAVENOUS | Status: DC
Start: 2024-07-24 — End: 2024-07-26
  Administered 2024-07-24 – 2024-07-26 (×4): 3 mL via INTRAVENOUS

## 2024-07-23 MED ORDER — PLASMA-LYTE A IV SOLN: INTRAVENOUS | Status: DC | PRN

## 2024-07-23 MED ORDER — PANTOPRAZOLE SODIUM 40 MG IV SOLR
40.0000 mg | Freq: Every day | INTRAVENOUS | Status: AC
  Administered 2024-07-23 – 2024-07-24 (×2): 40 mg via INTRAVENOUS
  Filled 2024-07-23 (×2): qty 10

## 2024-07-23 MED ORDER — MIDAZOLAM HCL 2 MG/2ML IJ SOLN
INTRAMUSCULAR | Status: DC | PRN
  Administered 2024-07-23: 2 mg via INTRAVENOUS
  Administered 2024-07-23 (×2): 1 mg via INTRAVENOUS

## 2024-07-23 MED ORDER — 0.9 % SODIUM CHLORIDE (POUR BTL) OPTIME
TOPICAL | Status: DC | PRN
  Administered 2024-07-23: 5000 mL

## 2024-07-23 MED ORDER — CEFAZOLIN SODIUM-DEXTROSE 2-4 GM/100ML-% IV SOLN
2.0000 g | Freq: Three times a day (TID) | INTRAVENOUS | Status: AC
  Administered 2024-07-23 – 2024-07-25 (×6): 2 g via INTRAVENOUS
  Filled 2024-07-23 (×6): qty 100

## 2024-07-23 MED ORDER — METOPROLOL TARTRATE 25 MG/10 ML ORAL SUSPENSION
12.5000 mg | Freq: Two times a day (BID) | ORAL | Status: DC
  Filled 2024-07-23 (×2): qty 5

## 2024-07-23 MED ORDER — ROCURONIUM BROMIDE 10 MG/ML (PF) SYRINGE
PREFILLED_SYRINGE | INTRAVENOUS | Status: AC
  Filled 2024-07-23: qty 10

## 2024-07-23 MED ORDER — SODIUM CHLORIDE 0.9% FLUSH: 3.0000 mL | INTRAVENOUS | Status: DC | PRN

## 2024-07-23 MED ORDER — MIDAZOLAM HCL 2 MG/2ML IJ SOLN
INTRAMUSCULAR | Status: AC
Start: 2024-07-23 — End: 2024-07-23
  Filled 2024-07-23: qty 2

## 2024-07-23 MED ORDER — PANTOPRAZOLE SODIUM 40 MG PO TBEC
40.0000 mg | DELAYED_RELEASE_TABLET | Freq: Every day | ORAL | Status: DC
  Administered 2024-07-25 – 2024-07-28 (×4): 40 mg via ORAL
  Filled 2024-07-23 (×4): qty 1

## 2024-07-23 MED ORDER — BISACODYL 10 MG RE SUPP: 10.0000 mg | Freq: Every day | RECTAL | Status: DC

## 2024-07-23 MED ORDER — METHYLPREDNISOLONE SODIUM SUCC 125 MG IJ SOLR
INTRAMUSCULAR | Status: AC
  Filled 2024-07-23: qty 2

## 2024-07-23 MED ORDER — NITROGLYCERIN IN D5W 200-5 MCG/ML-% IV SOLN: 0.0000 ug/min | INTRAVENOUS | Status: DC

## 2024-07-23 MED ORDER — ROCURONIUM BROMIDE 10 MG/ML (PF) SYRINGE
PREFILLED_SYRINGE | INTRAVENOUS | Status: AC
  Filled 2024-07-23: qty 20

## 2024-07-23 MED ORDER — ~~LOC~~ CARDIAC SURGERY, PATIENT & FAMILY EDUCATION
Freq: Once | Status: DC
  Filled 2024-07-23: qty 1

## 2024-07-23 MED ORDER — HEMOSTATIC AGENTS (NO CHARGE) OPTIME
TOPICAL | Status: DC | PRN
  Administered 2024-07-23 (×4): 1 via TOPICAL

## 2024-07-23 MED ORDER — DEXMEDETOMIDINE HCL IN NACL 400 MCG/100ML IV SOLN: 0.0000 ug/kg/h | INTRAVENOUS | Status: DC

## 2024-07-23 MED ORDER — TRAMADOL HCL 50 MG PO TABS
50.0000 mg | ORAL_TABLET | ORAL | Status: DC | PRN
  Administered 2024-07-24: 100 mg via ORAL
  Filled 2024-07-23: qty 2

## 2024-07-23 MED ORDER — FENTANYL CITRATE (PF) 250 MCG/5ML IJ SOLN
INTRAMUSCULAR | Status: DC | PRN
  Administered 2024-07-23 (×2): 150 ug via INTRAVENOUS
  Administered 2024-07-23: 50 ug via INTRAVENOUS
  Administered 2024-07-23 (×2): 150 ug via INTRAVENOUS
  Administered 2024-07-23: 250 ug via INTRAVENOUS
  Administered 2024-07-23: 50 ug via INTRAVENOUS
  Administered 2024-07-23: 200 ug via INTRAVENOUS
  Administered 2024-07-23: 100 ug via INTRAVENOUS

## 2024-07-23 MED ORDER — ACETAMINOPHEN 160 MG/5ML PO SOLN
650.0000 mg | Freq: Once | ORAL | Status: AC
  Administered 2024-07-23: 650 mg
  Filled 2024-07-23: qty 20.3

## 2024-07-23 MED ORDER — PROTAMINE SULFATE 10 MG/ML IV SOLN
INTRAVENOUS | Status: AC
  Filled 2024-07-23: qty 50

## 2024-07-23 MED ORDER — ACETAMINOPHEN 500 MG PO TABS
1000.0000 mg | ORAL_TABLET | Freq: Four times a day (QID) | ORAL | Status: DC
  Administered 2024-07-23 – 2024-07-28 (×17): 1000 mg via ORAL
  Filled 2024-07-23 (×18): qty 2

## 2024-07-23 MED ORDER — THROMBIN 20000 UNITS EX SOLR: CUTANEOUS | Status: DC | PRN

## 2024-07-23 MED ORDER — PROPOFOL 500 MG/50ML IV EMUL
INTRAVENOUS | Status: DC | PRN
  Administered 2024-07-23: 25 ug/kg/min via INTRAVENOUS

## 2024-07-23 MED ORDER — NOREPINEPHRINE 4 MG/250ML-% IV SOLN: 0.0000 ug/min | INTRAVENOUS | Status: DC

## 2024-07-23 MED ORDER — METOPROLOL TARTRATE 12.5 MG HALF TABLET
12.5000 mg | ORAL_TABLET | Freq: Once | ORAL | Status: AC
  Administered 2024-07-23: 12.5 mg via ORAL
  Filled 2024-07-23: qty 1

## 2024-07-23 MED ORDER — BISACODYL 5 MG PO TBEC
10.0000 mg | DELAYED_RELEASE_TABLET | Freq: Every day | ORAL | Status: DC
  Administered 2024-07-24 – 2024-07-28 (×2): 10 mg via ORAL
  Filled 2024-07-23 (×4): qty 2

## 2024-07-23 MED ORDER — METHYLPREDNISOLONE SODIUM SUCC 125 MG IJ SOLR
INTRAMUSCULAR | Status: DC | PRN
  Administered 2024-07-23: 125 mg via INTRAVENOUS

## 2024-07-23 MED ORDER — SODIUM CHLORIDE 0.9 % IV SOLN: INTRAVENOUS | Status: DC | PRN

## 2024-07-23 MED ORDER — ALBUMIN HUMAN 5 % IV SOLN
250.0000 mL | INTRAVENOUS | Status: DC | PRN
  Administered 2024-07-23 (×3): 12.5 g via INTRAVENOUS
  Filled 2024-07-23: qty 250

## 2024-07-23 MED ORDER — POTASSIUM CHLORIDE 10 MEQ/50ML IV SOLN: 10.0000 meq | INTRAVENOUS | Status: AC

## 2024-07-23 MED ORDER — HEPARIN SODIUM (PORCINE) 1000 UNIT/ML IJ SOLN
INTRAMUSCULAR | Status: AC
  Filled 2024-07-23: qty 1

## 2024-07-23 MED ORDER — LIDOCAINE 2% (20 MG/ML) 5 ML SYRINGE
INTRAMUSCULAR | Status: AC
  Filled 2024-07-23: qty 5

## 2024-07-23 MED ORDER — SODIUM CHLORIDE 0.9 % IV SOLN: 250.0000 mL | INTRAVENOUS | Status: AC

## 2024-07-23 MED ORDER — ASPIRIN 325 MG PO TBEC
325.0000 mg | DELAYED_RELEASE_TABLET | Freq: Every day | ORAL | Status: DC
  Administered 2024-07-24 – 2024-07-25 (×2): 325 mg via ORAL
  Filled 2024-07-23 (×2): qty 1

## 2024-07-23 MED ORDER — CYCLOBENZAPRINE HCL 10 MG PO TABS: 10.0000 mg | ORAL_TABLET | Freq: Three times a day (TID) | ORAL | Status: DC | PRN

## 2024-07-23 MED ORDER — MAGNESIUM SULFATE 4 GM/100ML IV SOLN
4.0000 g | Freq: Once | INTRAVENOUS | Status: AC
Start: 2024-07-23 — End: 2024-07-23
  Administered 2024-07-23: 4 g via INTRAVENOUS
  Filled 2024-07-23: qty 100

## 2024-07-23 MED ORDER — ASPIRIN 81 MG PO CHEW
324.0000 mg | CHEWABLE_TABLET | Freq: Once | ORAL | Status: AC
Start: 2024-07-23 — End: 2024-07-23
  Administered 2024-07-23: 324 mg via ORAL
  Filled 2024-07-23: qty 4

## 2024-07-23 MED ORDER — TRANEXAMIC ACID 1000 MG/10ML IV SOLN
1.5000 mg/kg/h | INTRAVENOUS | Status: DC
  Filled 2024-07-23: qty 25

## 2024-07-23 MED ORDER — ETOMIDATE 2 MG/ML IV SOLN
INTRAVENOUS | Status: AC
  Filled 2024-07-23: qty 10

## 2024-07-23 MED ORDER — PROPOFOL 10 MG/ML IV BOLUS
INTRAVENOUS | Status: AC
  Filled 2024-07-23: qty 20

## 2024-07-23 MED ORDER — THROMBIN (RECOMBINANT) 20000 UNITS EX SOLR
CUTANEOUS | Status: AC
  Filled 2024-07-23: qty 20000

## 2024-07-23 MED ORDER — ACETAMINOPHEN 160 MG/5ML PO SOLN: 1000.0000 mg | Freq: Four times a day (QID) | ORAL | Status: DC

## 2024-07-23 MED ORDER — MIDAZOLAM HCL 2 MG/2ML IJ SOLN
INTRAMUSCULAR | Status: AC
  Filled 2024-07-23: qty 2

## 2024-07-23 MED ORDER — ONDANSETRON HCL 4 MG/2ML IJ SOLN: 4.0000 mg | Freq: Four times a day (QID) | INTRAMUSCULAR | Status: DC | PRN

## 2024-07-23 MED ORDER — MORPHINE SULFATE (PF) 2 MG/ML IV SOLN
1.0000 mg | INTRAVENOUS | Status: DC | PRN
  Administered 2024-07-24 – 2024-07-25 (×3): 2 mg via INTRAVENOUS
  Filled 2024-07-23 (×3): qty 1

## 2024-07-23 MED ORDER — SODIUM CHLORIDE 0.45 % IV SOLN: INTRAVENOUS | Status: AC | PRN

## 2024-07-23 MED ORDER — PAROXETINE HCL 10 MG PO TABS
10.0000 mg | ORAL_TABLET | Freq: Every morning | ORAL | Status: DC
  Administered 2024-07-24 – 2024-07-28 (×5): 10 mg via ORAL
  Filled 2024-07-23 (×5): qty 1

## 2024-07-23 MED ORDER — LIDOCAINE HCL (CARDIAC) PF 100 MG/5ML IV SOSY
PREFILLED_SYRINGE | INTRAVENOUS | Status: DC | PRN
Start: 2024-07-23 — End: 2024-07-23
  Administered 2024-07-23: 100 mg via INTRAVENOUS

## 2024-07-23 MED ORDER — LACTATED RINGERS IV SOLN: INTRAVENOUS | Status: AC

## 2024-07-23 MED ORDER — OXYCODONE HCL 5 MG PO TABS
5.0000 mg | ORAL_TABLET | ORAL | Status: DC | PRN
  Administered 2024-07-23 – 2024-07-25 (×6): 10 mg via ORAL
  Filled 2024-07-23 (×6): qty 2

## 2024-07-23 MED ORDER — VANCOMYCIN HCL IN DEXTROSE 1-5 GM/200ML-% IV SOLN
1000.0000 mg | Freq: Once | INTRAVENOUS | Status: AC
  Administered 2024-07-23: 1000 mg via INTRAVENOUS
  Filled 2024-07-23: qty 200

## 2024-07-23 MED ORDER — NITROGLYCERIN 0.2 MG/ML ON CALL CATH LAB
INTRAVENOUS | Status: DC | PRN
Start: 2024-07-23 — End: 2024-07-23
  Administered 2024-07-23: 10 ug via INTRAVENOUS
  Administered 2024-07-23: 20 ug via INTRAVENOUS
  Administered 2024-07-23: 10 ug via INTRAVENOUS
  Administered 2024-07-23: 20 ug via INTRAVENOUS
  Administered 2024-07-23: 10 ug via INTRAVENOUS
  Administered 2024-07-23: 20 ug via INTRAVENOUS

## 2024-07-23 MED ORDER — PHENYLEPHRINE 80 MCG/ML (10ML) SYRINGE FOR IV PUSH (FOR BLOOD PRESSURE SUPPORT)
PREFILLED_SYRINGE | INTRAVENOUS | Status: DC | PRN
  Administered 2024-07-23: 80 ug via INTRAVENOUS
  Administered 2024-07-23: 40 ug via INTRAVENOUS
  Administered 2024-07-23 (×2): 80 ug via INTRAVENOUS
  Administered 2024-07-23: 40 ug via INTRAVENOUS
  Administered 2024-07-23: 80 ug via INTRAVENOUS

## 2024-07-23 MED ORDER — HEPARIN SODIUM (PORCINE) 1000 UNIT/ML IJ SOLN
INTRAMUSCULAR | Status: DC | PRN
  Administered 2024-07-23: 44000 [IU] via INTRAVENOUS

## 2024-07-23 MED ORDER — CHLORHEXIDINE GLUCONATE 0.12 % MT SOLN
15.0000 mL | OROMUCOSAL | Status: AC
  Administered 2024-07-23: 15 mL via OROMUCOSAL
  Filled 2024-07-23: qty 15

## 2024-07-23 MED ORDER — SODIUM CHLORIDE 0.9 % IV SOLN
0.3000 ug/kg | Freq: Once | INTRAVENOUS | Status: AC
  Administered 2024-07-23: 22.59 ug via INTRAVENOUS
  Filled 2024-07-23: qty 5.6

## 2024-07-23 MED ORDER — METOPROLOL TARTRATE 5 MG/5ML IV SOLN: 2.5000 mg | INTRAVENOUS | Status: DC | PRN

## 2024-07-23 MED ORDER — ROCURONIUM BROMIDE 10 MG/ML (PF) SYRINGE
PREFILLED_SYRINGE | INTRAVENOUS | Status: DC | PRN
  Administered 2024-07-23: 30 mg via INTRAVENOUS
  Administered 2024-07-23: 20 mg via INTRAVENOUS
  Administered 2024-07-23: 100 mg via INTRAVENOUS
  Administered 2024-07-23 (×2): 50 mg via INTRAVENOUS

## 2024-07-23 MED ORDER — CHLORHEXIDINE GLUCONATE 0.12 % MT SOLN
15.0000 mL | Freq: Once | OROMUCOSAL | Status: AC
  Administered 2024-07-23: 15 mL via OROMUCOSAL
  Filled 2024-07-23: qty 15

## 2024-07-23 MED ORDER — DOCUSATE SODIUM 100 MG PO CAPS
200.0000 mg | ORAL_CAPSULE | Freq: Every day | ORAL | Status: DC
  Administered 2024-07-24 – 2024-07-28 (×5): 200 mg via ORAL
  Filled 2024-07-23 (×5): qty 2

## 2024-07-23 MED ORDER — DEXTROSE 50 % IV SOLN: 0.0000 mL | INTRAVENOUS | Status: DC | PRN

## 2024-07-23 MED ORDER — DEXMEDETOMIDINE HCL IN NACL 400 MCG/100ML IV SOLN
0.1000 ug/kg/h | INTRAVENOUS | Status: DC
  Filled 2024-07-23: qty 100

## 2024-07-23 MED ORDER — INSULIN REGULAR(HUMAN) IN NACL 100-0.9 UT/100ML-% IV SOLN: INTRAVENOUS | Status: AC

## 2024-07-23 MED ORDER — ASPIRIN 81 MG PO CHEW
324.0000 mg | CHEWABLE_TABLET | Freq: Every day | ORAL | Status: DC
Start: 2024-07-24 — End: 2024-07-26

## 2024-07-23 MED ORDER — CHLORHEXIDINE GLUCONATE 4 % EX SOLN: 30.0000 mL | CUTANEOUS | Status: DC

## 2024-07-23 MED ORDER — PHENYLEPHRINE 80 MCG/ML (10ML) SYRINGE FOR IV PUSH (FOR BLOOD PRESSURE SUPPORT)
PREFILLED_SYRINGE | INTRAVENOUS | Status: AC
Start: 2024-07-23 — End: 2024-07-23
  Filled 2024-07-23: qty 10

## 2024-07-23 MED ORDER — METOPROLOL TARTRATE 12.5 MG HALF TABLET
12.5000 mg | ORAL_TABLET | Freq: Two times a day (BID) | ORAL | Status: DC
  Administered 2024-07-24 – 2024-07-25 (×4): 12.5 mg via ORAL
  Filled 2024-07-23 (×4): qty 1

## 2024-07-23 SURGICAL SUPPLY — 82 items
ADAPTER CARDIO PERF ANTE/RETRO (ADAPTER) ×1 IMPLANT
BAG DECANTER FOR FLEXI CONT (MISCELLANEOUS) ×1 IMPLANT
BLADE CLIPPER SURG (BLADE) ×1 IMPLANT
BLADE STERNUM SYSTEM 6 (BLADE) ×1 IMPLANT
BLADE SURG 15 STRL LF DISP TIS (BLADE) ×1 IMPLANT
CANISTER SUCTION 3000ML PPV (SUCTIONS) ×1 IMPLANT
CANNULA AORTIC ROOT 9FR (CANNULA) ×1 IMPLANT
CANNULA GUNDRY RCSP 15FR (MISCELLANEOUS) ×1 IMPLANT
CANNULA MC2 2 STG 36/46 NON-V (CANNULA) IMPLANT
CANNULA NON VENT 20FR 12 (CANNULA) IMPLANT
CANNULA NON VENT 22FR 12 (CANNULA) IMPLANT
CANNULA SUMP PERICARDIAL (CANNULA) IMPLANT
CATH HEART VENT LEFT (CATHETERS) ×1 IMPLANT
CATH ROBINSON RED A/P 18FR (CATHETERS) ×3 IMPLANT
CATH THOR RT ANG 28F 9128 SOFT (CATHETERS) ×1 IMPLANT
CATH THORACIC 36FR (CATHETERS) ×1 IMPLANT
CAUTERY SURG HI TEMP FINE TIP (MISCELLANEOUS) IMPLANT
CNTNR URN SCR LID CUP LEK RST (MISCELLANEOUS) ×1 IMPLANT
CONTAINER PROTECT SURGISLUSH (MISCELLANEOUS) ×2 IMPLANT
DEVICE SUT CK QUICK LOAD INDV (Prosthesis & Implant Heart) IMPLANT
DEVICE SUT CK QUICK LOAD MINI (Prosthesis & Implant Heart) IMPLANT
DRAIN CHANNEL 19F RND (DRAIN) IMPLANT
DRAIN CONNECTOR BLAKE 1:1 (MISCELLANEOUS) IMPLANT
DRAPE SRG 135X102X78XABS (DRAPES) ×1 IMPLANT
DRAPE WARM FLUID 44X44 (DRAPES) ×1 IMPLANT
DRESSING PEEL AND PLAC PRVNA20 (GAUZE/BANDAGES/DRESSINGS) IMPLANT
DRSG COVADERM 4X14 (GAUZE/BANDAGES/DRESSINGS) ×1 IMPLANT
ELECTRODE REM PT RTRN 9FT ADLT (ELECTROSURGICAL) ×2 IMPLANT
FELT TEFLON 1X6 (MISCELLANEOUS) ×1 IMPLANT
GAUZE SPONGE 4X4 12PLY STRL (GAUZE/BANDAGES/DRESSINGS) ×1 IMPLANT
GLOVE BIO SURGEON STRL SZ 6.5 (GLOVE) ×2 IMPLANT
GOWN STRL REUS W/ TWL LRG LVL3 (GOWN DISPOSABLE) ×5 IMPLANT
GOWN STRL REUS W/ TWL XL LVL3 (GOWN DISPOSABLE) ×2 IMPLANT
GRAFT HEMASHIELD 30X10 (Vascular Products) IMPLANT
GRAFT WOVEN D/V 26DX30L (Vascular Products) IMPLANT
HEMOSTAT POWDER SURGIFOAM 1G (HEMOSTASIS) ×3 IMPLANT
HEMOSTAT SURGICEL 2X14 (HEMOSTASIS) ×1 IMPLANT
INSERT FOGARTY XLG (MISCELLANEOUS) IMPLANT
KIT BASIN OR (CUSTOM PROCEDURE TRAY) ×1 IMPLANT
KIT DRSG PREVENA PLUS 7DAY 125 (MISCELLANEOUS) IMPLANT
KIT SUCTION CATH 14FR (SUCTIONS) ×1 IMPLANT
KIT SUT CK MINI COMBO 4X17 (Prosthesis & Implant Heart) IMPLANT
KIT TURNOVER KIT B (KITS) ×1 IMPLANT
LINE VENT (MISCELLANEOUS) IMPLANT
LOOP VASCLR EXTRA MAXI WHITE (MISCELLANEOUS) IMPLANT
NS IRRIG 1000ML POUR BTL (IV SOLUTION) ×5 IMPLANT
PACK E OPEN HEART (SUTURE) ×1 IMPLANT
PACK OPEN HEART (CUSTOM PROCEDURE TRAY) ×1 IMPLANT
PAD ELECT DEFIB RADIOL ZOLL (MISCELLANEOUS) ×1 IMPLANT
PENCIL BUTTON HOLSTER BLD 10FT (ELECTRODE) ×1 IMPLANT
POSITIONER HEAD DONUT 9IN (MISCELLANEOUS) ×1 IMPLANT
SEALANT HEMOST PREVELEAK 4ML (HEMOSTASIS) IMPLANT
SET MPS 3-ND DEL (MISCELLANEOUS) IMPLANT
SET VEIN GRAFT PERF (SET/KITS/TRAYS/PACK) IMPLANT
SUT BONE WAX W31G (SUTURE) ×1 IMPLANT
SUT EB EXC GRN/WHT 2-0 V-5 (SUTURE) ×2 IMPLANT
SUT ETHIBON EXCEL 2-0 V-5 (SUTURE) IMPLANT
SUT ETHIBOND V-5 VALVE (SUTURE) IMPLANT
SUT ETHIBOND X763 2 0 SH 1 (SUTURE) ×2 IMPLANT
SUT PROLENE 3 0 SH 48 (SUTURE) IMPLANT
SUT PROLENE 3 0 SH DA (SUTURE) IMPLANT
SUT PROLENE 4 0 SH DA (SUTURE) ×1 IMPLANT
SUT PROLENE 4-0 RB1 .5 CRCL 36 (SUTURE) ×1 IMPLANT
SUT PROLENE 5 0 C 1 36 (SUTURE) IMPLANT
SUT SILK 1 MH (SUTURE) IMPLANT
SUT SILK 2 0SH CR/8 30 (SUTURE) ×1 IMPLANT
SUT STEEL 6MS V (SUTURE) ×1 IMPLANT
SUT STEEL STERNAL CCS#1 18IN (SUTURE) IMPLANT
SUT STEEL SZ 6 DBL 3X14 BALL (SUTURE) ×2 IMPLANT
SUT VIC AB 1 CTX36XBRD ANBCTR (SUTURE) ×2 IMPLANT
SUT VIC AB 2-0 CT1 TAPERPNT 27 (SUTURE) IMPLANT
SUT VIC AB 2-0 CTX 27 (SUTURE) IMPLANT
SYSTEM SAHARA CHEST DRAIN ATS (WOUND CARE) ×1 IMPLANT
TAPE CLOTH SURG 4X10 WHT LF (GAUZE/BANDAGES/DRESSINGS) IMPLANT
TAPE PAPER 2X10 WHT MICROPORE (GAUZE/BANDAGES/DRESSINGS) IMPLANT
TOWEL GREEN STERILE (TOWEL DISPOSABLE) ×1 IMPLANT
TOWEL GREEN STERILE FF (TOWEL DISPOSABLE) ×1 IMPLANT
TRAY FOLEY SLVR 16FR TEMP STAT (SET/KITS/TRAYS/PACK) ×1 IMPLANT
TUBE SUCT INTRACARD DLP 20F (MISCELLANEOUS) ×1 IMPLANT
UNDERPAD 30X36 HEAVY ABSORB (UNDERPADS AND DIAPERS) ×1 IMPLANT
VALVE ON-X AORTIC 23MM (Prosthesis & Implant Heart) IMPLANT
WATER STERILE IRR 1000ML POUR (IV SOLUTION) ×2 IMPLANT

## 2024-07-23 NOTE — Anesthesia Procedure Notes (Signed)
 Central Venous Catheter Insertion Performed by: Keneth Lynwood POUR, MD, anesthesiologist Start/End9/19/2025 7:00 AM, 07/23/2024 7:15 AM Patient location: Pre-op. Preanesthetic checklist: patient identified, IV checked, site marked, risks and benefits discussed, surgical consent, monitors and equipment checked, pre-op evaluation, timeout performed and anesthesia consent Position: Trendelenburg Lidocaine  1% used for infiltration and patient sedated Hand hygiene performed , maximum sterile barriers used  and Seldinger technique used Catheter size: 8.5 Fr Central line was placed.MAC introducer Swan type:thermodilution Procedure performed using ultrasound guided technique. Ultrasound Notes:anatomy identified, needle tip was noted to be adjacent to the nerve/plexus identified, no ultrasound evidence of intravascular and/or intraneural injection and image(s) printed for medical record Attempts: 1 Following insertion, line sutured and dressing applied. Post procedure assessment: blood return through all ports, free fluid flow and no air  Patient tolerated the procedure well with no immediate complications.

## 2024-07-23 NOTE — Anesthesia Procedure Notes (Signed)
 Procedure Name: Intubation Date/Time: 07/23/2024 7:50 AM  Performed by: Claudene Arlin LABOR, CRNAPre-anesthesia Checklist: Patient identified, Emergency Drugs available, Suction available and Patient being monitored Patient Re-evaluated:Patient Re-evaluated prior to induction Oxygen Delivery Method: Circle system utilized Preoxygenation: Pre-oxygenation with 100% oxygen Induction Type: IV induction Ventilation: Mask ventilation without difficulty Laryngoscope Size: Glidescope and 3 Grade View: Grade I Tube type: Oral Tube size: 8.0 mm Number of attempts: 1 Airway Equipment and Method: Rigid stylet and Video-laryngoscopy Placement Confirmation: ETT inserted through vocal cords under direct vision, positive ETCO2 and breath sounds checked- equal and bilateral Secured at: 22 cm Tube secured with: Tape Dental Injury: Teeth and Oropharynx as per pre-operative assessment  Difficulty Due To: Difficulty was anticipated, Difficult Airway- due to large tongue and Difficult Airway- due to limited oral opening

## 2024-07-23 NOTE — Discharge Instructions (Addendum)
 Discharge Instructions:  1. You may shower, please wash incisions daily with soap and water  and keep dry.  If you wish to cover wounds with dressing you may do so but please keep clean and change daily.  No tub baths or swimming until incisions have completely healed.  If your incisions become red or develop any drainage please call our office at [phone]  2. No Driving until cleared by Dr. Linward office and you are no longer using narcotic pain medications  3. Monitor your weight daily.. Please use the same scale and weigh at same time... If you gain 5-10 lbs in 48 hours with associated lower extremity swelling, please contact our office at [phone]  4. Fever of 101.5 for at least 24 hours with no source, please contact our office at [phone]  5. Activity- up as tolerated, please walk at least 3 times per day.  Avoid strenuous activity, no lifting, pushing, or pulling with your arms over 8-10 lbs for a minimum of 6 weeks  6. If any questions or concerns arise, please do not hesitate to contact our office at [phone]   Warfarin Information Warfarin  (Common brand name is Coumadin ) is a blood thinner (anticoagulant). Anticoagulants help to prevent the formation of blood clots or keep them from getting bigger. Your health care provider will monitor the anticoagulation effect of warfarin closely and will adjust your medicine as needed. Who should use warfarin? Warfarin is prescribed for people who have blood clots, or who are at risk for developing harmful blood clots, such as people who: Have mechanical heart valves. Have irregular heart rhythms (atrial fibrillation). Have certain clotting disorders. Have had blood clots in the past or are currently receiving treatment for them. This includes people who have had a stroke, blood clots in the lungs (pulmonary embolism, or PE), or blood clots in the legs (deep vein thrombosis,or DVT). How is warfarin taken? Warfarin is taken by mouth  (orally). Warfarin tablets come in different strengths. The strength is printed on the tablet, and each strength is a different color. If you get a new prescription and the color of your tablet is different than usual, tell your pharmacist or health care provider immediately. Take warfarin exactly as told by your health care provider, at the same time every day. Doing this helps you avoid bleeding or blood clots that could result in serious injury, pain, or disability. Contact your health care provider if a dose is forgotten or missed. Do not change or take additional dosesto make up for missed or accidental extra doses. What blood tests do I need while taking warfarin?  Warfarin is a medicine that needs to be closely monitored with blood tests. It is very important to keep all lab visits and follow-up visits with your health care provider. These tests measure the blood's ability to clot and are called prothrombin tests (PT)or international normalized ratio (INR) tests. These tests can be done with a finger stick or a blood draw. What does the INR test result mean? The PT test results will be reported as the INR. Your health care provider will tell you your target INR range. If your INR is not in your target range, your health care provider may adjust your dosage. If your INR is above your target range, there is a risk of bleeding. Your dosage of warfarin may need to be decreased. If your INR is below your target range, there is a risk of clotting. Your dosage of warfarin may need to  be increased. How often is the INR test needed? When you first start warfarin, you will usually have your INR checked every few days until the health care provider determines the correct dosage of warfarin. After you have reached your target INR, your INR will be tested less often. However, you will need to have your INR checked at least once every 4-6 weeks while you take warfarin. Some people may be able to use home  monitoring to check their INR. Ask your health care provider if this applies to you. What are the side effects of warfarin? Too much warfarin can cause bleeding or hemorrhage in any part of the body, such as: Bleeding from the gums. Unexplained bruises or bruises that get larger. A nosebleed that is not easily stopped. Bleeding in the brain (hemorrhagic stroke). Coughing up or vomiting blood. Blood in the urine or stools. Warfarin may also cause: Skin rash or irritations. Nausea that does not go away. Severe pain in the back or joints. Painful toes that turn blue or purple (purple toe syndrome). Painful ulcers that do not go away (skin necrosis). What precautions do I need to take while using warfarin? Wear a medical alert bracelet or carry a card that lists what medicines you take. Make sure that all health care providers, including your dentist, know you are taking warfarin. Avoid situations that cause bleeding by: Using a softer toothbrush. Flossing with waxed floss. Shaving with an Neurosurgeon, not with a blade. Limiting your use of sharp objects. Avoiding activities that put you at risk for injury, such as contact sports. What do I need to know about warfarin and pregnancy or breastfeeding? If you are taking warfarin and you become pregnant, or plan to become pregnant, contact your health care provider right away. Though warfarin has been associated with birth defects, it can be used in some cases after weighing risks to mother and baby. If you plan to breastfeed while taking warfarin, talk with your health care provider first. What do I need to know about warfarin and alcohol or drug use? Do not drink alcohol if: Your health care provider tells you not to drink. You are pregnant, may be pregnant, or are planning to become pregnant. If you drink alcohol: Limit how much you have to: 0-1 drink a day for women. 0-2 drinks a day for men. Know how much alcohol is in your drink.  In the [place] one drink equals one 12 oz bottle of beer (355 mL), one 5 oz glass of wine (148 mL), or one 1 oz glass of hard liquor (44 mL). If you change the amount of alcohol that you drink, tell your health care provider. Your warfarin dosage may need to be changed. Do not use any products that contain nicotine or tobacco. These products include cigarettes, chewing tobacco, and vaping devices, such as e-cigarettes. If you need help quitting, ask your health care provider. If you use nicotine or tobacco products and change the amount that you use, tell your health care provider. Your warfarin dosage may need to be changed. Avoid drug use while taking warfarin. The effects of drugs on warfarin are not known. What do I need to know about warfarin and other medicines or supplements? Many prescription and over-the-counter medicines can interfere with warfarin. Talk with your health care provider or your pharmacist before starting or stopping any new medicines. This includes vitamins, herbs, supplements, and pain medicines. Some common over-the-counter medicines that may increase the risk of dangerous bleeding while  taking warfarin include: Aspirin . NSAIDs, such as ibuprofen or naproxen. Vitamin E. Fish oils. What do I need to know about warfarin and my diet? Vitamin K decreases the effect of warfarin, and it is found in many foods. Eat a consistent amount of foods that contain vitamin K. For example, you may decide to eat 2 servings of vitamin K-containing foods each day. It is important to maintain a normal, balanced diet while taking warfarin. Avoid major changes in your diet. If you are going to change your diet, talk with your health care provider before making changes. Your health care provider may recommend that you work with a dietitian. Contact a health care provider if you: Miss a dose. Take an extra dose. Plan to have any kind of surgery or procedure. Ask whether you should stop taking  warfarin or change your dose before your surgery. Are unable to take your medicine due to nausea, vomiting, or diarrhea. Have any major changes in your diet, or you plan to make major changes in your diet. Start or stop any over-the-counter medicine, prescription medicine, herbal supplement, or dietary supplement. Become pregnant, plan to become pregnant, or think you may be pregnant. Have menstrual periods that are heavier than usual. Have unusual bruising. Get help right away if you: Have signs of an allergic reaction, such as: Swelling of the lips, face, tongue, mouth, or throat. Rash or itchy, red, swollen areas of skin (hives). Trouble breathing. Chest tightness. Fall or have an accident, especially if you hit your head. Have signs that your blood is too thin, such as: Blood in your urine. Your urine may look reddish, pinkish, or tea-colored. Blood in your stool. Your stool may be black or bright red. Coughing up or vomiting blood. The blood may be bright red, or it may look like coffee grounds. Bleeding that does not stop after applying pressure to the area for 30 minutes. Have signs of a blood clot in your leg or arm, such as: Pain or swelling in your leg or arm. Skin that is red or warm to the touch on your arm or leg. Have signs of blood in your lung, such as: Shortness of breath or difficulty breathing. Chest pain. Unexplained fever. Have any symptoms of a stroke. BE FAST is an easy way to remember the main warning signs of a stroke: B - Balance. Signs are dizziness, sudden trouble walking, or loss of balance. E - Eyes. Signs are trouble seeing or a sudden change in vision. F - Face. Signs are sudden weakness or numbness of the face, or the face or eyelid drooping on one side. A - Arms. Signs are weakness or numbness in an arm. This happens suddenly and usually on one side of the body. S - Speech. Signs are sudden trouble speaking, slurred speech, or trouble understanding  what people say. T - Time. Time to call emergency services. Write down what time symptoms started. Have other signs of a stroke, such as: A sudden, severe headache with no known cause. Nausea or vomiting. Seizure. Have other signs of a reaction to warfarin, such as: Purple or blue toes. Skin ulcers that do not go away. These symptoms may represent a serious problem that is an emergency. Do not wait to see if the symptoms will go away. Get medical help right away. Call your local emergency services (911 in the [place] Do not drive yourself to the hospital. Summary Warfarin is a medicine that thins blood. It is used to prevent or  treat blood clots. You must be monitored closely while on this medicine. Keep all follow-up visits. Make sure that you know your target INR range and your warfarin dosage. Wear or carry identification that says you are taking warfarin. Take warfarin at the same time every day. Call your health care provider if you miss a dose or if you take an extra dose. Do not change the dosage of warfarin on your own. Know the signs and symptoms of blood clots, bleeding, and a stroke. Know when to get emergency medical help. This information is not intended to replace advice given to you by your health care provider. Make sure you discuss any questions you have with your health care provider. Document Revised: 07/17/2023 Document Reviewed: 07/17/2023 Elsevier Patient Education  2024 [employer].

## 2024-07-23 NOTE — Progress Notes (Signed)
 Patient extubated per heart rapid wean protocol and placed on 3L [place] with RT and RN at bedside. Cuff leak heard prior to extubating no stridor noted post. NIF -25, VC 1.2L. vitals stable.

## 2024-07-23 NOTE — Anesthesia Procedure Notes (Signed)
 Arterial Line Insertion Start/End9/19/2025 6:50 AM, 07/23/2024 6:55 AM Performed by: Claudene Arlin LABOR, CRNA, CRNA  Patient location: Pre-op. Preanesthetic checklist: patient identified, IV checked, site marked, risks and benefits discussed, surgical consent, monitors and equipment checked, pre-op evaluation, timeout performed and anesthesia consent Lidocaine  1% used for infiltration and patient sedated Left, radial was placed Catheter size: 20 G Hand hygiene performed  and maximum sterile barriers used  Allen's test indicative of satisfactory collateral circulation Attempts: 1 Procedure performed without using ultrasound guided technique. Following insertion, dressing applied and Biopatch. Post procedure assessment: normal and unchanged  Patient tolerated the procedure well with no immediate complications.

## 2024-07-23 NOTE — Anesthesia Postprocedure Evaluation (Signed)
 Anesthesia Post Note  Patient: Jerry Parrish  Procedure(s) Performed: AORTIC VALVE REPLACEMENT USING ON-X AORTIC VALVE SIZE ASCENDING AORTA REPLACEMENT USING HEMASHIELD PLATINUM SINGLE SIDE-ARM GRAFT SIZE 30MMx10MM AND HEMASHIELD PLATINUM GRAFT ECHOCARDIOGRAM, TRANSESOPHAGEAL, INTRAOPERATIVE     Patient location during evaluation: ICU Anesthesia Type: General Level of consciousness: sedated Pain management: pain level controlled Vital Signs Assessment: post-procedure vital signs reviewed and stable Respiratory status: patient remains intubated per anesthesia plan and patient on ventilator - see flowsheet for VS Cardiovascular status: stable Postop Assessment: no apparent nausea or vomiting Anesthetic complications: no   No notable events documented.  Last Vitals:  Vitals:   07/23/24 0718 07/23/24 0719  BP:    Pulse: 60 (!) 59  Resp: 17   Temp:    SpO2: 97% 97%    Last Pain:  Vitals:   07/23/24 0604  TempSrc:   PainSc: 0-No pain                 Lynwood MARLA Cornea

## 2024-07-23 NOTE — Interval H&P Note (Signed)
 History and Physical Interval Note:  07/23/2024 6:36 AM  Jerry Parrish [place]  has presented today for surgery, with the diagnosis of AORTIC STENOSIS THORACIC AORTIC ANEURYSM.  The various methods of treatment have been discussed with the patient and family. After consideration of risks, benefits and other options for treatment, the patient has consented to  Procedure(s) with comments: REPLACEMENT, AORTIC VALVE, OPEN (N/A) REPLACEMENT, AORTA, ASCENDING (N/A) - CIRC ARREST ECHOCARDIOGRAM, TRANSESOPHAGEAL, INTRAOPERATIVE (N/A) as a surgical intervention.  The patient's history has been reviewed, patient examined, no change in status, stable for surgery.  I have reviewed the patient's chart and labs.  Questions were answered to the patient's satisfaction.     Con Parrish Saudia Smyser

## 2024-07-23 NOTE — Transfer of Care (Signed)
 Immediate Anesthesia Transfer of Care Note  Patient: Jerry Parrish  Procedure(s) Performed: AORTIC VALVE REPLACEMENT USING ON-X AORTIC VALVE SIZE ASCENDING AORTA REPLACEMENT USING HEMASHIELD PLATINUM SINGLE SIDE-ARM GRAFT SIZE 30MMx10MM AND HEMASHIELD PLATINUM GRAFT ECHOCARDIOGRAM, TRANSESOPHAGEAL, INTRAOPERATIVE  Patient Location: ICU  Anesthesia Type:General  Level of Consciousness: sedated and Patient remains intubated per anesthesia plan  Airway & Oxygen Therapy: Patient remains intubated per anesthesia plan and Patient placed on Ventilator (see vital sign flow sheet for setting)  Post-op Assessment: Report given to RN and Post -op Vital signs reviewed and stable  Post vital signs: Reviewed and stable  Last Vitals:  Vitals Value Taken Time  BP 99/55  68 07/23/2024 1509  Temp    Pulse 80 07/23/24 15:09  Resp 16 07/23/24 15:09  SpO2 97 % 07/23/24 15:09  Vitals shown include unfiled device data.  Last Pain:  Vitals:   07/23/24 0604  TempSrc:   PainSc: 0-No pain      Patients Stated Pain Goal: 0 (07/23/24 0604)  Complications: No notable events documented.

## 2024-07-23 NOTE — Progress Notes (Signed)
  Echocardiogram Echocardiogram Transesophageal has been performed.  Thea Norlander 07/23/2024, 9:50 AM

## 2024-07-23 NOTE — Op Note (Signed)
 CARDIOVASCULAR SURGERY OPERATIVE NOTE  07/23/2024  Surgeon:  Con Clunes, MD  First Assistant: Dorise Fellers, MD   Preoperative Diagnosis:  Severe aortic stenosis, 4.5 cm ascending aortic aneurysm   Postoperative Diagnosis:  Same   Procedure:  Median Sternotomy Extracorporeal circulation 3.   Replacement of ascending aorta (hemi-arch) using a 30 mm Hemashiel graft under deep hypothermic circulatory arrest 4.  Aortic valve replacement with 23 mm On-X mechanical valve  Anesthesia:  General Endotracheal  Clinical History/Surgical Indication:  Mr. Blackburn is a healthy 58 year old man with history of bicuspid valve and severe aortic stenosis with ascending aortic aneurysm. He has worsening symptoms of leg swelling and dyspnea, but continues to be very functional and active. The quality of his CTA chest is poor with motion artifact, but on my own measurement, as well as that of a cardiology imager, the aorta measures at least 4.3 x 4.4 cm. Guidelines recommend aortic replacement for patients with bicuspid valve having surgery with an aneurysm > 4.5 cm. Given his young age and desire to avoid a second heart operation, we will replace his ascending aorta at the time of valve replacement.   Preparation:  The patient was seen in the preoperative holding area and the correct patient, correct operation were confirmed with the patient after reviewing the medical record and catheterization. The consent was signed by me. Preoperative antibiotics were given. A radial arterial line were placed by the anesthesia team. The patient was taken back to the operating room and positioned supine on the operating room table. After being placed under general endotracheal anesthesia by the anesthesia team a foley catheter was placed. The neck, chest, abdomen, and both legs were prepped with betadine soap and solution and draped in the usual sterile manner. A surgical time-out was taken and the correct patient and  operative procedure were confirmed with the nursing and anesthesia staff.   Cardiopulmonary Bypass:  A median sternotomy was performed. The pericardium was opened in the midline. Right ventricular function appeared normal. The ascending aorta was aneurysmal and had no palpable plaque. There were no contraindications to aortic cannulation. The patient was fully systemically heparinized and the ACT was maintained > 400 sec. The proximal aortic arch was cannulated with a 22 F aortic cannula for arterial inflow. Venous cannulation was performed via the right atrial appendage using a two-staged venous cannula.   Resection and grafting of ascending aortic aneurysm:  The patient was placed on cardiopulmonary bypass and a left ventricular vent was placed via the right superior pulmonary vein. Systemic cooling was begun with a goal temperature of 22 degrees centigrade by a bladder temperature probe. A retrograde cardioplegia cannula was placed through the right atrium into the coronary sinus without difficulty. A retrograde cerebraplegia cannula was placed into the SVC through a pursestring suture and the SVC was encircled with a silastic tape. After 20 minutes of cooling the target temperature of 22 degrees centigrade was reached. Cerebral oximetry was 70% bilaterally. BIS was zero. The patient was given propofol  and 125 mg of Solumedrol. The head was packed in ice. The bed was placed in steep trendelenburg. Circulatory arrest was begun and the blood volume emptied into the venous reservoir. Continuous retrograde cerebraplegia was begun and the SVC occluded with the silastic tape. Cold blood retrograde cardioplegia was given to the calculated dose. Complete diastolic arrest was maintained. The aortic cannula was removed. The aorta was transected just proximal to the innominate artery beveling the resection out along the undersurface of  the aortic arch (Hemiarch replacement). The aortic diameter was measured at 30  mm here. A 30 mm x 10 mm Hemasheild Platinum vascular graft was prepared. ( Catalog # [id number] P0, Lot # [id number]). It was anastomosed to the aortic arch in an end to end manner using 3-0 prolene continuous suture with a felt strip to reinforce the anastomosis. A light coating of Prevaleak was applied to seal needle holes. The arterial end of the bypass circuit was then connected to the 10mm side arm graft and circulation was slowly resumed. The tape was removed from the SVC. The aortic graft was cross-clamped proximal to the side arm graft and full CPB support was resumed. Circulatory arrest time was 25 min. Retrograde cerebraplegia time was 11 min.   Aortic valve replacement:   The ascending aorta was mobilized from the right pulmonary artery and main PA. It was opened longitudinally and the valve inspected. It was heavily calcified and appeared probably tricuspid.  The native valve was excised taking care to remove all particulate debri. The annulus was decalcified with rongeurs. The annulus was sized and a 21 mm On-X mechanical valve was chosen. ( Ref # [id number], Serial # [id number]) A series of pledgetted 2-0 Ethibond horizontal mattress sutures were placed around the annulus with the pledgets in a sub-annular position. The valve was lowered into place and the sutures at the hinge posts tied first followed by the remaining sutures. The valve seated nicely. The discs moved normally.    The aorta at the STJ was measured and sized to a 26 mm graft. (Ref # [id number] P0, Serial # [id number]).  It was anastomosed to the proximal aorta in an end-to-end manner using 3-0 prolene continuous suture with a felt strip for reinforcement.  A light coating of Prevaleak was applied.  The two grafts were then cut to the appropriate length and anastomosed end to end using continuous 3-0 prolene suture. Prevaleak was applied to seal the needle holes in the grafts. A vent cannula was placed into the graft to remove any air.  Deairing maneuvers were performed and the bed placed in trendelenburg position.  Completion:  The patient was rewarmed to 37 degrees Centigrade. The crossclamp was removed with a time of 138 minutes. There was spontaneous return of sinus rhythm. The distal and proximal anastomoses were checked for hemostasis. The vascular anastomoses all appeared hemostatic. Two temporary epicardial pacing wires were placed on the right atrium and two on the right ventricle. The patient was weaned from CPB without difficulty on no inotropes. CPB time was 214 minutes. Heparin  was fully reversed with protamine  and the aortic and venous cannulas removed. Hemostasis was achieved. Mediastinal drainage tubes were placed. The sternum was closed with stainless steel wires. The fascia was closed with continuous # 1 vicryl suture. The subcutaneous tissue was closed with 2-0 vicryl continuous suture. The skin was closed with 3-0 vicryl subcuticular suture. A Prevena wound vac was then placed along the sternal incision.  All sponge, needle, and instrument counts were reported correct at the end of the case. Dry sterile dressings were placed around the chest tubes which were connected to pleurevac suction. The patient was then transported to the surgical intensive care unit in critical but stable condition.

## 2024-07-23 NOTE — Hospital Course (Addendum)
   History of Present Illness:     Mr. Jerry Parrish is a 58 year old man who presents for surgical evaluation of severe aortic stenosis and ascending aortic aneurysm.  He has known he had a murmur since 58 years old, but recently had a repeat ECHO which demonstrated severe aortic stenosis (AVA 0.84, MG 76, Vmax 5.98), normal biventricular function and no other valvular abnormalities.  R/LHC showed no significant CAD with midly elevated filling pressure and low normal CO/CI.  CT scan demonstrated an enlarged ascending aorta (4.4 cm) with a normal root.   He denies chest pain, but reports shortness of breath and some dizzy spells.  Over the summer he was out camping in hot weather, when he developed dizziness with leg swelling and a general sense of feeling awful all over.  He suspected it was heat stroke and it got better on its own.  He reports some leg swelling that worsens with walking.    He is a non-smoker and drinks alcohol rarely. He denies history of stroke or kidney issues.  He works as a Scientist, forensic.  Dr. Daniel reviewed the patient's diagnostic studies and determined he would benefit from surgical intervention. She reviewed the patient's treatment options as well as the risks and benefits of surgery with the patient. [name] was agreeable to proceed with surgery.  Hospital Course: [name] presented to [facility] and underwent aortic valve replacement utilizing a 23mm On-X aortic valve and ascending aorta replacement utilizing a 53mmx10mm Hemashield platinum single side arm graft and a 26mm Hemashield platinum graft. He tolerated the procedure well and was transferred to the SICU in stable condition. Vital signs and hemodynamics remained stable. He was extubated by 6pm on the day of surgery.  The monitoring lines were removed on the first post-op day and he was mobilized routinely. Coumadin  anticoagulation was initiated on post-op day 1. He was transferred to 4E Progressive  Care on post-op day 2.  He regained independence with mobility and the oxygen was weaned off. By the 3rd post-op day, the INR was 3.0 so the Coumadin  was held until later that evening when the INR had decreased to 2.0. The mediastinal drain was removed.

## 2024-07-23 NOTE — Consult Note (Signed)
 NAME:  Jerry Parrish, MRN:  [id number], DOB:  1966-07-29, LOS: 0 ADMISSION DATE:  07/23/2024, CONSULTATION DATE:  9/19 REFERRING MD:  Daniel MA CHIEF COMPLAINT:  s/p AVR, ascending aorta replacement   History of Present Illness:  58 year old male with past medical history of hypertension, severe AS, thoracic aortic aneurysm who presents for AVR and ascending aorta replacement with Dr. Daniel. He was initially seen by Dr. Darliss for shortness of breath and reported history of bicuspid aortic valve over 15 years ago. He had an echocardiogram on 07/02/2024 demonstrating LVEF 55-60%, G1DD, LA mod dilated, mild-mod MVR, mild AVR, severe AS. He then underwent RHC and coronary angiography showing no significant CAD, but mild L/R and pulmonary artery pressures. He had CT scan showing ascending aorta 4.4cm. He was referred to [facility] for surgical evaluation of his aorta. 9/19 admitted for surgery.   Pump time: 214 Xclamp time: 138 Cell saver: 0 EBL: 1125 UOP: 1525   Pertinent  Medical History  Hypertension, thoracic aortic aneurysm, aortic stenosis   Significant Hospital Events: Including procedures, antibiotic start and stop dates in addition to other pertinent events   9/19: to ICU s/p AVR, ascending aorta replacement   Interim History / Subjective:   Norepinephrine  1 Precedex  + propofol  TXA infusing, DDAVP  given Insulin  gtt 2.8 SIMV 600 x 16, PS 23, PEEP 5    Objective   Blood pressure (!) 152/84, pulse (!) 59, temperature 98.1 F (36.7 C), temperature source Oral, resp. rate 17, height 5' 11 (1.803 m), weight 132.5 kg, SpO2 97%.        Intake/Output Summary (Last 24 hours) at 07/23/2024 0845 Last data filed at 07/23/2024 0840 Gross per 24 hour  Intake 650 ml  Output 70 ml  Net 580 ml   Filed Weights   07/23/24 0603  Weight: 132.5 kg    Examination: General: Obese pale gentleman, intubated, sedated and paralyzed HENT: ET tube in good position, no secretions Lungs:  Decreased to both bases, coarse bilaterally. Cardiovascular: 80, paced, distant.  Chest tubes, mediastinal tube, wound VAC Abdomen: Obese, nondistended, no bowel sounds heard Extremities: No edema Neuro: Sedated and paralyzed GU: Foley  Resolved Hospital Problem list    Assessment & Plan:  Severe aortic stenosis s/p AVR (mechanical) Thoracic aortic aneurysm s/p replacement  LVEF 55-60%, G1DD, LA mod dilated, mild-mod MVR, mild AVR, severe AS. Preoperative ascending aorta 4.4cm.  - post-op management per [facility] - CXR/ ABG, CBC, BMET now - con't weaning pressors as able to maintain MAP >65 -85. Albumin  boluses prn for low SV.  - tele monitoring/ pacing prn (currently paced 80) - mediastinal drains per [facility] - multimodal pain control per protocol- oxycodone , tramadol , morphine  with bowel regimen - ASA, statin to resume 9/20 - metoprolol  BID once off pressors - complete post-op antibiotics - monitor electrolytes, replete PRN   Post-operative vent management  - rapid wean per [facility] protocol  - full mechanical vent support - lung protective ventilation 6-8cc/kg Vt - VAP and PAD bundle in place  - titrate FiO2 to sat goal >92  - maintain peak/plats <30, driving pressures <84    History of hypertension  -Not on home regimen, add depending on course, blood pressure trend  Depression - On Paxil  at home.  Add back when feasible to get p.o.  GERD - Pantoprazole  as ordered   Labs   CBC: Recent Labs  Lab 07/22/24 1008 07/23/24 0756 07/23/24 0801  WBC 6.2  --   --   HGB 14.5  13.3 13.3  HCT 43.4 39.0 39.0  MCV 83.5  --   --   PLT 295  --   --     Basic Metabolic Panel: Recent Labs  Lab 07/22/24 1008 07/23/24 0756 07/23/24 0801  NA 140 140 140  K 4.3 4.0 4.1  CL 103 104  --   CO2 23  --   --   GLUCOSE 102* 117*  --   BUN 14 14  --   CREATININE 0.83 0.90  --   CALCIUM 8.9  --   --    GFR: Estimated Creatinine Clearance: 125.8 mL/min (by C-G formula based on SCr of  0.9 mg/dL). Recent Labs  Lab 07/22/24 1008  WBC 6.2    Liver Function Tests: Recent Labs  Lab 07/22/24 1008  AST 22  ALT 24  ALKPHOS 42  BILITOT 0.8  PROT 7.3  ALBUMIN  4.1   No results for input(s): LIPASE, AMYLASE in the last 168 hours. No results for input(s): AMMONIA in the last 168 hours.  ABG    Component Value Date/Time   PHART 7.333 (L) 07/23/2024 0801   PCO2ART 49.7 (H) 07/23/2024 0801   PO2ART 396 (H) 07/23/2024 0801   HCO3 26.4 07/23/2024 0801   TCO2 28 07/23/2024 0801   ACIDBASEDEF 1.0 07/09/2024 1048   O2SAT 100 07/23/2024 0801     Coagulation Profile: Recent Labs  Lab 07/22/24 1008  INR 1.0    Cardiac Enzymes: No results for input(s): CKTOTAL, CKMB, CKMBINDEX, TROPONINI in the last 168 hours.  HbA1C: Hgb A1c MFr Bld  Date/Time Value Ref Range Status  07/22/2024 10:08 AM 4.8 4.8 - 5.6 % Final    Comment:    (NOTE) Diagnosis of Diabetes The following HbA1c ranges recommended by the American Diabetes Association (ADA) may be used as an aid in the diagnosis of diabetes mellitus.  Hemoglobin             Suggested A1C NGSP%              Diagnosis  <5.7                   Non Diabetic  5.7-6.4                Pre-Diabetic  >6.4                   Diabetic  <7.0                   Glycemic control for                       adults with diabetes.    07/18/2013 05:30 AM 5.6 <5.7 % Final    Comment:    (NOTE)                                                                       According to the ADA Clinical Practice Recommendations for 2011, when HbA1c is used as a screening test:  >=6.5%   Diagnostic of Diabetes Mellitus           (if abnormal result is confirmed) 5.7-6.4%   Increased risk of developing Diabetes Mellitus References:Diagnosis and Classification of  Diabetes Mellitus,Diabetes Care,2011,34(Suppl 1):S62-S69 and Standards of Medical Care in         Diabetes - 2011,Diabetes Care,2011,34 (Suppl 1):S11-S61.     CBG: No results for input(s): GLUCAP in the last 168 hours.  Review of Systems:   See HPI  Past Medical History:  He,  has a past medical history of Carpal tunnel syndrome, Chest pain, Depression, Heart murmur, Hemorrhoids, HTN (hypertension), Obesity, and T wave inversion in EKG.   Surgical History:   Past Surgical History:  Procedure Laterality Date   MELANOMA EXCISION     left upper arm   RIGHT HEART CATH AND CORONARY ANGIOGRAPHY Bilateral 07/09/2024   Procedure: RIGHT HEART CATH AND CORONARY ANGIOGRAPHY;  Surgeon: Mady Bruckner, MD;  Location: [facility] INVASIVE CV LAB;  Service: Cardiovascular;  Laterality: Bilateral;     Social History:   reports that he has never smoked. He does not have any smokeless tobacco history on file. He reports that he does not currently use alcohol. He reports that he does not use drugs.   Family History:  His family history includes Diabetes Mellitus I in his father; Hypertension in his mother; Prostate cancer in his father.   Allergies No Known Allergies   Home Medications  Prior to Admission medications   Medication Sig Start Date End Date Taking? Authorizing Provider  cyclobenzaprine  (FLEXERIL ) 10 MG tablet Take 10 mg by mouth 3 (three) times daily as needed for muscle spasms.   Yes [provider]  omeprazole (PRILOSEC) 20 MG capsule Take 20 mg by mouth daily. Patient taking differently: Take 20 mg by mouth daily as needed (Indigestion/heartburn.).   Yes [provider]  PARoxetine  (PAXIL ) 10 MG tablet Take 10 mg by mouth every morning. 01/21/24  Yes [provider]     Critical care time: 35 min     Lamar Chris, MD, PhD 07/23/2024, 3:28 PM [facility] [phone] or if no answer before 7:00PM call [phone] For any issues after 7:00PM please call eLink [phone]

## 2024-07-24 ENCOUNTER — Inpatient Hospital Stay (HOSPITAL_COMMUNITY)

## 2024-07-24 DIAGNOSIS — Z952 Presence of prosthetic heart valve: Secondary | ICD-10-CM

## 2024-07-24 DIAGNOSIS — Z95828 Presence of other vascular implants and grafts: Secondary | ICD-10-CM

## 2024-07-24 LAB — CBC
HCT: 28.6 % — ABNORMAL LOW (ref 39.0–52.0)
Hemoglobin: 9.3 g/dL — ABNORMAL LOW (ref 13.0–17.0)
MCH: 27.9 pg (ref 26.0–34.0)
MCHC: 32.5 g/dL (ref 30.0–36.0)
MCV: 85.9 fL (ref 80.0–100.0)
Platelets: 148 K/uL — ABNORMAL LOW (ref 150–400)
RBC: 3.33 MIL/uL — ABNORMAL LOW (ref 4.22–5.81)
RDW: 13.9 % (ref 11.5–15.5)
WBC: 11.1 K/uL — ABNORMAL HIGH (ref 4.0–10.5)
nRBC: 0 % (ref 0.0–0.2)

## 2024-07-24 LAB — BASIC METABOLIC PANEL WITH GFR
Anion gap: 7 (ref 5–15)
BUN: 13 mg/dL (ref 6–20)
CO2: 22 mmol/L (ref 22–32)
Calcium: 7.3 mg/dL — ABNORMAL LOW (ref 8.9–10.3)
Chloride: 107 mmol/L (ref 98–111)
Creatinine, Ser: 0.88 mg/dL (ref 0.61–1.24)
GFR, Estimated: >60 mL/min (ref >60)
Glucose, Bld: 118 mg/dL — ABNORMAL HIGH (ref 70–99)
Potassium: 4.4 mmol/L (ref 3.5–5.1)
Sodium: 136 mmol/L (ref 135–145)

## 2024-07-24 LAB — GLUCOSE, CAPILLARY
Glucose-Capillary: 108 mg/dL — ABNORMAL HIGH (ref 70–99)
Glucose-Capillary: 109 mg/dL — ABNORMAL HIGH (ref 70–99)
Glucose-Capillary: 115 mg/dL — ABNORMAL HIGH (ref 70–99)
Glucose-Capillary: 116 mg/dL — ABNORMAL HIGH (ref 70–99)
Glucose-Capillary: 119 mg/dL — ABNORMAL HIGH (ref 70–99)
Glucose-Capillary: 123 mg/dL — ABNORMAL HIGH (ref 70–99)
Glucose-Capillary: 123 mg/dL — ABNORMAL HIGH (ref 70–99)
Glucose-Capillary: 125 mg/dL — ABNORMAL HIGH (ref 70–99)
Glucose-Capillary: 140 mg/dL — ABNORMAL HIGH (ref 70–99)
Glucose-Capillary: 175 mg/dL — ABNORMAL HIGH (ref 70–99)

## 2024-07-24 LAB — MAGNESIUM: Magnesium: 2.8 mg/dL — ABNORMAL HIGH (ref 1.7–2.4)

## 2024-07-24 MED ORDER — WARFARIN - PHYSICIAN DOSING INPATIENT
Freq: Every day | Status: DC
Start: 2024-07-24 — End: 2024-07-28

## 2024-07-24 MED ORDER — INSULIN ASPART 100 UNIT/ML IJ SOLN
0.0000 [IU] | INTRAMUSCULAR | Status: DC
Start: 2024-07-24 — End: 2024-07-25
  Administered 2024-07-25 (×3): 2 [IU] via SUBCUTANEOUS

## 2024-07-24 MED ORDER — INSULIN GLARGINE 100 UNIT/ML ~~LOC~~ SOLN
9.0000 [IU] | Freq: Every day | SUBCUTANEOUS | Status: DC
  Administered 2024-07-24 – 2024-07-28 (×5): 9 [IU] via SUBCUTANEOUS
  Filled 2024-07-24 (×5): qty 0.09

## 2024-07-24 MED ORDER — CHLORHEXIDINE GLUCONATE CLOTH 2 % EX PADS
6.0000 | MEDICATED_PAD | Freq: Every day | CUTANEOUS | Status: DC
Start: 2024-07-24 — End: 2024-07-28
  Administered 2024-07-24 – 2024-07-28 (×5): 6 via TOPICAL

## 2024-07-24 MED ORDER — ORAL CARE MOUTH RINSE: 15.0000 mL | OROMUCOSAL | Status: DC | PRN

## 2024-07-24 MED ORDER — WARFARIN SODIUM 5 MG PO TABS
5.0000 mg | ORAL_TABLET | Freq: Every day | ORAL | Status: DC
  Administered 2024-07-24 – 2024-07-25 (×2): 5 mg via ORAL
  Filled 2024-07-24 (×2): qty 1

## 2024-07-24 MED ORDER — FUROSEMIDE 10 MG/ML IJ SOLN
40.0000 mg | Freq: Once | INTRAMUSCULAR | Status: AC
  Administered 2024-07-24: 40 mg via INTRAVENOUS
  Filled 2024-07-24: qty 4

## 2024-07-24 NOTE — Progress Notes (Signed)
      [address]       [place] [id number]             [phone]                 1 Day Post-Op Procedure(s) (LRB): AORTIC VALVE REPLACEMENT USING ON-X AORTIC VALVE SIZE (N/A) ASCENDING AORTA REPLACEMENT USING HEMASHIELD PLATINUM SINGLE SIDE-ARM GRAFT SIZE 30MMx10MM AND HEMASHIELD PLATINUM GRAFT (N/A) ECHOCARDIOGRAM, TRANSESOPHAGEAL, INTRAOPERATIVE (N/A)   Events: No events extubated _______________________________________________________________ Vitals: BP (!) 152/84   Pulse 86   Temp 98.4 F (36.9 C)   Resp (!) 26   Ht 5' 11 (1.803 m)   Wt (!) 137.2 kg   SpO2 93%   BMI 42.19 kg/m  Filed Weights   07/23/24 0603 07/24/24 0500  Weight: 132.5 kg (!) 137.2 kg     - Neuro: alert NAD  - Cardiovascular: sinus  Drips: none.   CVP:  [3 mmHg-55 mmHg] 52 mmHg CO:  [4.7 L/min-10.8 L/min] 8.4 L/min CI:  [1.9 L/min/m2-4.4 L/min/m2] 3.4 L/min/m2  - Pulm: EWOB  ABG    Component Value Date/Time   PHART 7.333 (L) 07/23/2024 1912   PCO2ART 40.3 07/23/2024 1912   PO2ART 79 (L) 07/23/2024 1912   HCO3 21.3 07/23/2024 1912   TCO2 22 07/23/2024 1912   ACIDBASEDEF 4.0 (H) 07/23/2024 1912   O2SAT 94 07/23/2024 1912    - Abd: ND - Extremity: trace edema  .Intake/Output      09/19 0701 09/20 0700 09/20 0701 09/21 0700   I.V. (mL/kg) 2383.9 (17.4)    Blood 595    IV Piggyback 2191.6    Total Intake(mL/kg) 5170.4 (37.7)    Urine (mL/kg/hr) 2515 (0.8)    Blood 1125    Chest Tube 250    Total Output 3890    Net +1280.4            _______________________________________________________________ Labs:    Latest Ref Rng & Units 07/24/2024    4:27 AM 07/23/2024    8:41 PM 07/23/2024    7:12 PM  CBC  WBC 4.0 - 10.5 K/uL 11.1  9.5    Hemoglobin 13.0 - 17.0 g/dL 9.3  9.8  9.9   Hematocrit 39.0 - 52.0 % 28.6  30.5  29.0   Platelets 150 - 400 K/uL 148  153        Latest Ref Rng & Units 07/24/2024    4:27 AM 07/23/2024    8:41 PM 07/23/2024     7:12 PM  CMP  Glucose 70 - 99 mg/dL 881  847    BUN 6 - 20 mg/dL 13  14    Creatinine 9.38 - 1.24 mg/dL 9.11  8.98    Sodium 864 - 145 mmol/L 136  138  138   Potassium 3.5 - 5.1 mmol/L 4.4  4.8  5.2   Chloride 98 - 111 mmol/L 107  110    CO2 22 - 32 mmol/L 22  21    Calcium 8.9 - 10.3 mg/dL 7.3  7.1      CXR: PV congestion  _______________________________________________________________  Assessment and Plan: POD 1 s/p AVR, hemiarch  Neuro: pain controlled CV: will remove a-line.  Will keep wires 1 more day Pulm: IS, ambulation Renal: creat stable, will diuresi GI: on diet Heme: starting coumadin  tonight ID: [email or web address] Endo: SSI Dispo: continue ICU care   Jerry Parrish 07/24/2024 7:53 AM

## 2024-07-24 NOTE — Plan of Care (Signed)

## 2024-07-24 NOTE — Progress Notes (Signed)
 NAME:  Jerry Parrish, MRN:  [id number], DOB:  [birth year], LOS: 1 ADMISSION DATE:  07/23/2024, CONSULTATION DATE:  9/19 REFERRING MD:  Daniel MA CHIEF COMPLAINT:  s/p AVR, ascending aorta replacement   History of Present Illness:  58 year old male with past medical history of hypertension, severe AS, thoracic aortic aneurysm who presents for AVR and ascending aorta replacement with Dr. Daniel. He was initially seen by Dr. Darliss for shortness of breath and reported history of bicuspid aortic valve over 15 years ago. He had an echocardiogram on 07/02/2024 demonstrating LVEF 55-60%, G1DD, LA mod dilated, mild-mod MVR, mild AVR, severe AS. He then underwent RHC and coronary angiography showing no significant CAD, but mild L/R and pulmonary artery pressures. He had CT scan showing ascending aorta 4.4cm. He was referred to [facility] for surgical evaluation of his aorta. 9/19 admitted for surgery.   Pump time: 214 Xclamp time: 138 Cell saver: 0 EBL: 1125 UOP: 1525   Pertinent  Medical History  Hypertension, thoracic aortic aneurysm, aortic stenosis   Significant Hospital Events: Including procedures, antibiotic start and stop dates in addition to other pertinent events   9/19: to ICU s/p AVR, ascending aorta replacement  9/19 extubated  Interim History / Subjective:  Extubated successfully 9/19 Norepinephrine  off Remains on insulin  drip, planning transition to off to SSI Adequate pain control although difficulty taking some deep breaths due to his pleural tubes I/O+ 1.1 L total, urine output 2500 cc / 24 hours    Objective   Blood pressure (!) 152/84, pulse 86, temperature 98.4 F (36.9 C), resp. rate (!) 26, height 5' 11 (1.803 m), weight (!) 137.2 kg, SpO2 93%. CVP:  [3 mmHg-55 mmHg] 52 mmHg CO:  [4.7 L/min-10.8 L/min] 8.4 L/min CI:  [1.9 L/min/m2-4.4 L/min/m2] 3.4 L/min/m2  Vent Mode: PSV;CPAP FiO2 (%):  [40 %] 40 % Set Rate:  [4 bmp-16 bmp] 4 bmp Vt Set:  [600 mL] 600 mL PEEP:  [5  cmH20] 5 cmH20 Pressure Support:  [10 cmH20] 10 cmH20 Plateau Pressure:  [20 cmH20] 20 cmH20   Intake/Output Summary (Last 24 hours) at 07/24/2024 0901 Last data filed at 07/24/2024 0800 Gross per 24 hour  Intake 4520.43 ml  Output 3920 ml  Net 600.43 ml   Filed Weights   07/23/24 0603 07/24/24 0500  Weight: 132.5 kg (!) 137.2 kg    Examination: General: Obese gentleman, comfortable, awake HENT: Strong voice, no secretions, no stridor Lungs: Decreased bibasilar breath sounds, otherwise clear Cardiovascular: Distant with 2/6 murmur, chest tubes in place Abdomen: Obese, hypoactive bowel sounds Extremities: No edema Neuro: Awake, alert, interacting appropriately, follows commands GU: Foley  Resolved Hospital Problem list    Assessment & Plan:  Severe aortic stenosis s/p AVR (mechanical) Thoracic aortic aneurysm s/p replacement  LVEF 55-60%, G1DD, LA mod dilated, mild-mod MVR, mild AVR, severe AS. Preoperative ascending aorta 4.4cm.  -Postop management per [facility] -Plan to remove A-line today - Continue pleural tubes, pacer wires today - Multimodal pain control: Oxycodone , tramadol , morphine , bowel regimen - Aspirin  as ordered - Metoprolol  as ordered - Complete postop prophylactic antibiotics - Plan to start warfarin evening 9/20  Post-operative vent management, extubated 9/19 -Push pulmonary hygiene  History of hypertension  -Was not on home regimen, scheduled metoprolol  ordered  Depression -Continue Paxil   GERD -Continue pantoprazole    Labs   CBC: Recent Labs  Lab 07/22/24 1008 07/23/24 0756 07/23/24 1150 07/23/24 1207 07/23/24 1517 07/23/24 1802 07/23/24 1912 07/23/24 2041 07/24/24 0427  WBC 6.2  --   --   --  15.4*  --   --  9.5 11.1*  HGB 14.5   < > 10.8*   < > 10.7* 9.5* 9.9* 9.8* 9.3*  HCT 43.4   < > 33.0*   < > 32.2* 28.0* 29.0* 30.5* 28.6*  MCV 83.5  --   --   --  83.9  --   --  85.0 85.9  PLT 295  --  195  --  167  --   --  153 148*   < > =  values in this interval not displayed.    Basic Metabolic Panel: Recent Labs  Lab 07/22/24 1008 07/23/24 0756 07/23/24 1105 07/23/24 1132 07/23/24 1207 07/23/24 1301 07/23/24 1305 07/23/24 1512 07/23/24 1802 07/23/24 1912 07/23/24 2041 07/24/24 0427  NA 140   < > 138   < > 138 139   < > 140 141 138 138 136  K 4.3   < > 4.6   < > 4.9 4.4   < > 4.3 4.8 5.2* 4.8 4.4  CL 103   < > 103  --  102 104  --   --   --   --  110 107  CO2 23  --   --   --   --   --   --   --   --   --  21* 22  GLUCOSE 102*   < > 154*  --  152* 159*  --   --   --   --  152* 118*  BUN 14   < > 12  --  13 12  --   --   --   --  14 13  CREATININE 0.83   < > 0.90  --  0.80 0.70  --   --   --   --  1.01 0.88  CALCIUM 8.9  --   --   --   --   --   --   --   --   --  7.1* 7.3*  MG  --   --   --   --   --   --   --   --   --   --  3.4* 2.8*   < > = values in this interval not displayed.   GFR: Estimated Creatinine Clearance: 131.1 mL/min (by C-G formula based on SCr of 0.88 mg/dL). Recent Labs  Lab 07/22/24 1008 07/23/24 1517 07/23/24 2041 07/24/24 0427  WBC 6.2 15.4* 9.5 11.1*    Liver Function Tests: Recent Labs  Lab 07/22/24 1008  AST 22  ALT 24  ALKPHOS 42  BILITOT 0.8  PROT 7.3  ALBUMIN  4.1   No results for input(s): LIPASE, AMYLASE in the last 168 hours. No results for input(s): AMMONIA in the last 168 hours.  ABG    Component Value Date/Time   PHART 7.333 (L) 07/23/2024 1912   PCO2ART 40.3 07/23/2024 1912   PO2ART 79 (L) 07/23/2024 1912   HCO3 21.3 07/23/2024 1912   TCO2 22 07/23/2024 1912   ACIDBASEDEF 4.0 (H) 07/23/2024 1912   O2SAT 94 07/23/2024 1912     Coagulation Profile: Recent Labs  Lab 07/22/24 1008 07/23/24 1517  INR 1.0 1.4*    Cardiac Enzymes: No results for input(s): CKTOTAL, CKMB, CKMBINDEX, TROPONINI in the last 168 hours.  HbA1C: Hgb A1c MFr Bld  Date/Time Value Ref Range Status  07/22/2024 10:08 AM 4.8 4.8 - 5.6 % Final    Comment:     (NOTE)  Diagnosis of Diabetes The following HbA1c ranges recommended by the American Diabetes Association (ADA) may be used as an aid in the diagnosis of diabetes mellitus.  Hemoglobin             Suggested A1C NGSP%              Diagnosis  <5.7                   Non Diabetic  5.7-6.4                Pre-Diabetic  >6.4                   Diabetic  <7.0                   Glycemic control for                       adults with diabetes.    07/18/2013 05:30 AM 5.6 <5.7 % Final    Comment:    (NOTE)                                                                       According to the ADA Clinical Practice Recommendations for 2011, when HbA1c is used as a screening test:  >=6.5%   Diagnostic of Diabetes Mellitus           (if abnormal result is confirmed) 5.7-6.4%   Increased risk of developing Diabetes Mellitus References:Diagnosis and Classification of Diabetes Mellitus,Diabetes Care,2011,34(Suppl 1):S62-S69 and Standards of Medical Care in         Diabetes - 2011,Diabetes Care,2011,34 (Suppl 1):S11-S61.    CBG: Recent Labs  Lab 07/24/24 0026 07/24/24 0122 07/24/24 0244 07/24/24 0428 07/24/24 0639  GLUCAP 140* 125* 119* 123* 123*     Critical care time: 31 min     Lamar Chris, MD, PhD 07/24/2024, 9:01 AM [facility] [phone] or if no answer before 7:00PM call [phone] For any issues after 7:00PM please call eLink [phone]

## 2024-07-25 ENCOUNTER — Inpatient Hospital Stay (HOSPITAL_COMMUNITY)

## 2024-07-25 LAB — CBC
HCT: 28.3 % — ABNORMAL LOW (ref 39.0–52.0)
Hemoglobin: 9.2 g/dL — ABNORMAL LOW (ref 13.0–17.0)
MCH: 28 pg (ref 26.0–34.0)
MCHC: 32.5 g/dL (ref 30.0–36.0)
MCV: 86.3 fL (ref 80.0–100.0)
Platelets: 149 K/uL — ABNORMAL LOW (ref 150–400)
RBC: 3.28 MIL/uL — ABNORMAL LOW (ref 4.22–5.81)
RDW: 13.9 % (ref 11.5–15.5)
WBC: 11.1 K/uL — ABNORMAL HIGH (ref 4.0–10.5)
nRBC: 0 % (ref 0.0–0.2)

## 2024-07-25 LAB — GLUCOSE, CAPILLARY
Glucose-Capillary: 102 mg/dL — ABNORMAL HIGH (ref 70–99)
Glucose-Capillary: 107 mg/dL — ABNORMAL HIGH (ref 70–99)
Glucose-Capillary: 108 mg/dL — ABNORMAL HIGH (ref 70–99)
Glucose-Capillary: 127 mg/dL — ABNORMAL HIGH (ref 70–99)
Glucose-Capillary: 131 mg/dL — ABNORMAL HIGH (ref 70–99)
Glucose-Capillary: 142 mg/dL — ABNORMAL HIGH (ref 70–99)
Glucose-Capillary: 98 mg/dL (ref 70–99)

## 2024-07-25 LAB — BASIC METABOLIC PANEL WITH GFR
Anion gap: 6 (ref 5–15)
BUN: 12 mg/dL (ref 6–20)
CO2: 26 mmol/L (ref 22–32)
Calcium: 7.7 mg/dL — ABNORMAL LOW (ref 8.9–10.3)
Chloride: 103 mmol/L (ref 98–111)
Creatinine, Ser: 0.73 mg/dL (ref 0.61–1.24)
GFR, Estimated: >60 mL/min (ref >60)
Glucose, Bld: 117 mg/dL — ABNORMAL HIGH (ref 70–99)
Potassium: 3.9 mmol/L (ref 3.5–5.1)
Sodium: 135 mmol/L (ref 135–145)

## 2024-07-25 LAB — PROTIME-INR
INR: 2 — ABNORMAL HIGH (ref 0.8–1.2)
Prothrombin Time: 23.8 s — ABNORMAL HIGH (ref 11.4–15.2)

## 2024-07-25 MED ORDER — POTASSIUM CHLORIDE CRYS ER 20 MEQ PO TBCR
40.0000 meq | EXTENDED_RELEASE_TABLET | Freq: Once | ORAL | Status: AC
Start: 2024-07-25 — End: 2024-07-25
  Administered 2024-07-25: 40 meq via ORAL
  Filled 2024-07-25: qty 2

## 2024-07-25 MED ORDER — WARFARIN SODIUM 2.5 MG PO TABS: 2.5000 mg | ORAL_TABLET | Freq: Every day | ORAL | Status: DC

## 2024-07-25 MED ORDER — SODIUM CHLORIDE 0.9 % IV SOLN: 250.0000 mL | INTRAVENOUS | Status: AC | PRN

## 2024-07-25 MED ORDER — SODIUM CHLORIDE 0.9% FLUSH
3.0000 mL | Freq: Two times a day (BID) | INTRAVENOUS | Status: DC
  Administered 2024-07-25 – 2024-07-28 (×7): 3 mL via INTRAVENOUS

## 2024-07-25 MED ORDER — FUROSEMIDE 40 MG PO TABS
40.0000 mg | ORAL_TABLET | Freq: Every day | ORAL | Status: DC
  Administered 2024-07-25 – 2024-07-28 (×4): 40 mg via ORAL
  Filled 2024-07-25 (×4): qty 1

## 2024-07-25 MED ORDER — SODIUM CHLORIDE 0.9% FLUSH: 3.0000 mL | INTRAVENOUS | Status: DC | PRN

## 2024-07-25 MED ORDER — ~~LOC~~ CARDIAC SURGERY, PATIENT & FAMILY EDUCATION: Freq: Once | Status: AC

## 2024-07-25 MED ORDER — INSULIN ASPART 100 UNIT/ML IJ SOLN
0.0000 [IU] | Freq: Three times a day (TID) | INTRAMUSCULAR | Status: DC
  Administered 2024-07-27: 2 [IU] via SUBCUTANEOUS

## 2024-07-25 NOTE — Progress Notes (Addendum)
 Patient received in the unit, he is alert and oriented, V/S obtained, CCMD notified, CHG bath given, al needs met, call bell in reach. CCMD notified ST depression on his tele, 12 lead EKG obtained and notified to PA, patient denied any symptoms.   07/25/24 1731  Vitals  Temp 99.4 F (37.4 C)  Temp Source Oral  BP 139/71  MAP (mmHg) 90  BP Location Right Arm  BP Method Automatic  Patient Position (if appropriate) Lying  Pulse Rate 77  Pulse Rate Source Monitor  ECG Heart Rate 76  Resp 19  Level of Consciousness  Level of Consciousness Alert  MEWS COLOR  MEWS Score Color Green  Oxygen Therapy  SpO2 96 %  O2 Device Room Air  Pain Assessment  Pain Scale 0-10  Pain Score 0  MEWS Score  MEWS Temp 0  MEWS Systolic 0  MEWS Pulse 0  MEWS RR 0  MEWS LOC 0  MEWS Score 0

## 2024-07-25 NOTE — Progress Notes (Signed)
 [address]       [place] [address]             [phone]                 2 Days Post-Op Procedure(s) (LRB): AORTIC VALVE REPLACEMENT USING ON-X AORTIC VALVE SIZE (N/A) ASCENDING AORTA REPLACEMENT USING HEMASHIELD PLATINUM SINGLE SIDE-ARM GRAFT SIZE 30MMx10MM AND HEMASHIELD PLATINUM GRAFT (N/A) ECHOCARDIOGRAM, TRANSESOPHAGEAL, INTRAOPERATIVE (N/A)   Events: No events  _______________________________________________________________ Vitals: BP (!) 156/76   Pulse 72   Temp 98.6 F (37 C) (Oral)   Resp 18   Ht 5' 11 (1.803 m)   Wt 134.4 kg   SpO2 95%   BMI 41.33 kg/m  Filed Weights   07/23/24 0603 07/24/24 0500 07/25/24 0500  Weight: 132.5 kg (!) 137.2 kg 134.4 kg     - Neuro: alert NAD  - Cardiovascular: sinus  Drips: none.      - Pulm: EWOB  ABG    Component Value Date/Time   PHART 7.333 (L) 07/23/2024 1912   PCO2ART 40.3 07/23/2024 1912   PO2ART 79 (L) 07/23/2024 1912   HCO3 21.3 07/23/2024 1912   TCO2 22 07/23/2024 1912   ACIDBASEDEF 4.0 (H) 07/23/2024 1912   O2SAT 94 07/23/2024 1912    - Abd: ND - Extremity: trace edema  .Intake/Output      09/20 0701 09/21 0700 09/21 0701 09/22 0700   [name]  120   I.V. (mL/kg) 43.3 (0.3)    Blood     IV Piggyback     Total Intake(mL/kg) 43.3 (0.3) 120 (0.9)   Urine (mL/kg/hr) 1475 (0.5) 275 (0.5)   Stool 0 0   Blood     Chest Tube 300 20   Total Output 1775 295   Net -1731.7 -175        Urine Occurrence 2 x 1 x   Stool Occurrence 1 x 1 x      _______________________________________________________________ Labs:    Latest Ref Rng & Units 07/25/2024    5:05 AM 07/24/2024    4:27 AM 07/23/2024    8:41 PM  CBC  WBC 4.0 - 10.5 K/uL 11.1  11.1  9.5   Hemoglobin 13.0 - 17.0 g/dL 9.2  9.3  9.8   Hematocrit 39.0 - 52.0 % 28.3  28.6  30.5   Platelets 150 - 400 K/uL 149  148  153       Latest Ref Rng & Units 07/25/2024    5:05 AM 07/24/2024    4:27 AM 07/23/2024     8:41 PM  CMP  Glucose 70 - 99 mg/dL 882  881  847   BUN 6 - 20 mg/dL 12  13  14    Creatinine 0.61 - 1.24 mg/dL 9.26  9.11  8.98   Sodium 135 - 145 mmol/L 135  136  138   Potassium 3.5 - 5.1 mmol/L 3.9  4.4  4.8   Chloride 98 - 111 mmol/L 103  107  110   CO2 22 - 32 mmol/L 26  22  21    Calcium 8.9 - 10.3 mg/dL 7.7  7.3  7.1     CXR: stable  _______________________________________________________________  Assessment and Plan: POD 2 s/p AVR, hemiarch  Neuro: pain controlled CV: will remove wires Pulm: IS, ambulation Renal: creat stable, diuresing GI: on diet Heme: good response to coumadin ,  reducing dose to 2.5mg  ID: afebrile Endo: SSI Dispo:floor   Jerry Parrish  Jerry Parrish 07/25/2024 10:51 AM

## 2024-07-25 NOTE — Progress Notes (Signed)
 NAME:  Jerry Parrish, MRN:  [id number], DOB:  1966-05-02, LOS: 2 ADMISSION DATE:  07/23/2024, CONSULTATION DATE:  9/19 REFERRING MD:  Daniel MA CHIEF COMPLAINT:  s/p AVR, ascending aorta replacement   History of Present Illness:  58 year old male with past medical history of hypertension, severe AS, thoracic aortic aneurysm who presents for AVR and ascending aorta replacement with Dr. Daniel. He was initially seen by Dr. Darliss for shortness of breath and reported history of bicuspid aortic valve over 15 years ago. He had an echocardiogram on 07/02/2024 demonstrating LVEF 55-60%, G1DD, LA mod dilated, mild-mod MVR, mild AVR, severe AS. He then underwent RHC and coronary angiography showing no significant CAD, but mild L/R and pulmonary artery pressures. He had CT scan showing ascending aorta 4.4cm. He was referred to [facility] for surgical evaluation of his aorta. 9/19 admitted for surgery.   Pump time: 214 Xclamp time: 138 Cell saver: 0 EBL: 1125 UOP: 1525   Pertinent  Medical History  Hypertension, thoracic aortic aneurysm, aortic stenosis   Significant Hospital Events: Including procedures, antibiotic start and stop dates in addition to other pertinent events   9/19: to ICU s/p AVR, ascending aorta replacement  9/19 extubated  Interim History / Subjective:  Nasal cannula O2 1 L/min Tolerating good p.o. Infusions all transitioned to off I/O- 330 cc total Warfarin started 9/20    Objective   Blood pressure 138/79, pulse 72, temperature 98.6 F (37 C), temperature source Oral, resp. rate 16, height 5' 11 (1.803 m), weight 134.4 kg, SpO2 93%.        Intake/Output Summary (Last 24 hours) at 07/25/2024 9167 Last data filed at 07/25/2024 0800 Gross per 24 hour  Intake 142.26 ml  Output 1695 ml  Net -1552.74 ml   Filed Weights   07/23/24 0603 07/24/24 0500 07/25/24 0500  Weight: 132.5 kg (!) 137.2 kg 134.4 kg    Examination: General: Obese, comfortable, up to chair HENT:  Oropharynx clear, no secretions, no stridor, strong voice Lungs: Decreased to both bases, otherwise clear Cardiovascular: Distant, 2/6 murmur Abdomen: Obese positive bowel sounds Extremities: No edema Neuro: Alert and awake, interacts appropriately, follows commands  Resolved Hospital Problem list    Assessment & Plan:  Severe aortic stenosis s/p AVR (mechanical) Thoracic aortic aneurysm s/p replacement  LVEF 55-60%, G1DD, LA mod dilated, mild-mod MVR, mild AVR, severe AS. Preoperative ascending aorta 4.4cm.  -Postop management per [facility] - Chest tube management as per [facility], hopefully out soon - Pain control: Oxycodone , tramadol , morphine , bowel regimen as ordered - Continue aspirin  as ordered - Metoprolol  as ordered - Warfarin started evening 9/20. ?? Whether he needs to be (or safe post-op to be) on heparin  as warfarin loads. Will discuss w pharmacy and Dr. Shyrl.   Post-operative vent management, extubated 9/19 -Push pulmonary hygiene  History of hypertension  -Was not on home regimen, continue scheduled metoprolol   Depression -continue Paxil   GERD -continue pantoprazole    Labs   CBC: Recent Labs  Lab 07/22/24 1008 07/23/24 0756 07/23/24 1150 07/23/24 1207 07/23/24 1517 07/23/24 1802 07/23/24 1912 07/23/24 2041 07/24/24 0427 07/25/24 0505  WBC 6.2  --   --   --  15.4*  --   --  9.5 11.1* 11.1*  HGB 14.5   < > 10.8*   < > 10.7* 9.5* 9.9* 9.8* 9.3* 9.2*  HCT 43.4   < > 33.0*   < > 32.2* 28.0* 29.0* 30.5* 28.6* 28.3*  MCV 83.5  --   --   --  83.9  --   --  85.0 85.9 86.3  PLT 295  --  195  --  167  --   --  153 148* 149*   < > = values in this interval not displayed.    Basic Metabolic Panel: Recent Labs  Lab 07/22/24 1008 07/23/24 0756 07/23/24 1207 07/23/24 1301 07/23/24 1305 07/23/24 1802 07/23/24 1912 07/23/24 2041 07/24/24 0427 07/25/24 0505  NA 140   < > 138 139   < > 141 138 138 136 135  K 4.3   < > 4.9 4.4   < > 4.8 5.2* 4.8 4.4 3.9  CL  103   < > 102 104  --   --   --  110 107 103  CO2 23  --   --   --   --   --   --  21* 22 26  GLUCOSE 102*   < > 152* 159*  --   --   --  152* 118* 117*  BUN 14   < > 13 12  --   --   --  14 13 12   CREATININE 0.83   < > 0.80 0.70  --   --   --  1.01 0.88 0.73  CALCIUM 8.9  --   --   --   --   --   --  7.1* 7.3* 7.7*  MG  --   --   --   --   --   --   --  3.4* 2.8*  --    < > = values in this interval not displayed.   GFR: Estimated Creatinine Clearance: 142.5 mL/min (by C-G formula based on SCr of 0.73 mg/dL). Recent Labs  Lab 07/23/24 1517 07/23/24 2041 07/24/24 0427 07/25/24 0505  WBC 15.4* 9.5 11.1* 11.1*    Liver Function Tests: Recent Labs  Lab 07/22/24 1008  AST 22  ALT 24  ALKPHOS 42  BILITOT 0.8  PROT 7.3  ALBUMIN  4.1   No results for input(s): LIPASE, AMYLASE in the last 168 hours. No results for input(s): AMMONIA in the last 168 hours.  ABG    Component Value Date/Time   PHART 7.333 (L) 07/23/2024 1912   PCO2ART 40.3 07/23/2024 1912   PO2ART 79 (L) 07/23/2024 1912   HCO3 21.3 07/23/2024 1912   TCO2 22 07/23/2024 1912   ACIDBASEDEF 4.0 (H) 07/23/2024 1912   O2SAT 94 07/23/2024 1912     Coagulation Profile: Recent Labs  Lab 07/22/24 1008 07/23/24 1517 07/25/24 0505  INR 1.0 1.4* 2.0*    Cardiac Enzymes: No results for input(s): CKTOTAL, CKMB, CKMBINDEX, TROPONINI in the last 168 hours.  HbA1C: Hgb A1c MFr Bld  Date/Time Value Ref Range Status  07/22/2024 10:08 AM 4.8 4.8 - 5.6 % Final    Comment:    (NOTE) Diagnosis of Diabetes The following HbA1c ranges recommended by the American Diabetes Association (ADA) may be used as an aid in the diagnosis of diabetes mellitus.  Hemoglobin             Suggested A1C NGSP%              Diagnosis  <5.7                   Non Diabetic  5.7-6.4                Pre-Diabetic  >6.4  Diabetic  <7.0                   Glycemic control for                       adults with  diabetes.    07/18/2013 05:30 AM 5.6 <5.7 % Final    Comment:    (NOTE)                                                                       According to the ADA Clinical Practice Recommendations for 2011, when HbA1c is used as a screening test:  >=6.5%   Diagnostic of Diabetes Mellitus           (if abnormal result is confirmed) 5.7-6.4%   Increased risk of developing Diabetes Mellitus References:Diagnosis and Classification of Diabetes Mellitus,Diabetes Care,2011,34(Suppl 1):S62-S69 and Standards of Medical Care in         Diabetes - 2011,Diabetes Care,2011,34 (Suppl 1):S11-S61.    CBG: Recent Labs  Lab 07/24/24 1610 07/24/24 2022 07/25/24 0003 07/25/24 0328 07/25/24 0742  GLUCAP 108* 116* 131* 127* 142*     Critical care time: NA     Lamar Chris, MD, PhD 07/25/2024, 8:32 AM [facility] [phone] or if no answer before 7:00PM call [phone] For any issues after 7:00PM please call eLink [phone]

## 2024-07-26 ENCOUNTER — Inpatient Hospital Stay (HOSPITAL_COMMUNITY)

## 2024-07-26 ENCOUNTER — Encounter (HOSPITAL_COMMUNITY): Payer: Self-pay

## 2024-07-26 LAB — GLUCOSE, CAPILLARY
Glucose-Capillary: 104 mg/dL — ABNORMAL HIGH (ref 70–99)
Glucose-Capillary: 124 mg/dL — ABNORMAL HIGH (ref 70–99)
Glucose-Capillary: 92 mg/dL (ref 70–99)
Glucose-Capillary: 101 mg/dL — ABNORMAL HIGH (ref 70–99)
Glucose-Capillary: 116 mg/dL — ABNORMAL HIGH (ref 70–99)

## 2024-07-26 LAB — BASIC METABOLIC PANEL WITH GFR
Anion gap: 9 (ref 5–15)
BUN: 9 mg/dL (ref 6–20)
CO2: 24 mmol/L (ref 22–32)
Calcium: 8.1 mg/dL — ABNORMAL LOW (ref 8.9–10.3)
Chloride: 105 mmol/L (ref 98–111)
Creatinine, Ser: 0.79 mg/dL (ref 0.61–1.24)
GFR, Estimated: >60 mL/min (ref >60)
Glucose, Bld: 99 mg/dL (ref 70–99)
Potassium: 4.1 mmol/L (ref 3.5–5.1)
Sodium: 138 mmol/L (ref 135–145)

## 2024-07-26 LAB — CBC
HCT: 29.2 % — ABNORMAL LOW (ref 39.0–52.0)
Hemoglobin: 9.5 g/dL — ABNORMAL LOW (ref 13.0–17.0)
MCH: 27.8 pg (ref 26.0–34.0)
MCHC: 32.5 g/dL (ref 30.0–36.0)
MCV: 85.4 fL (ref 80.0–100.0)
Platelets: 189 K/uL (ref 150–400)
RBC: 3.42 MIL/uL — ABNORMAL LOW (ref 4.22–5.81)
RDW: 13.9 % (ref 11.5–15.5)
WBC: 9.1 K/uL (ref 4.0–10.5)
nRBC: 0 % (ref 0.0–0.2)

## 2024-07-26 LAB — PROTIME-INR
INR: 3 — ABNORMAL HIGH (ref 0.8–1.2)
INR: 2.1 — ABNORMAL HIGH (ref 0.8–1.2)
Prothrombin Time: 32.7 s — ABNORMAL HIGH (ref 11.4–15.2)
Prothrombin Time: 25 s — ABNORMAL HIGH (ref 11.4–15.2)

## 2024-07-26 MED ORDER — METOPROLOL TARTRATE 25 MG/10 ML ORAL SUSPENSION: 12.5000 mg | Freq: Two times a day (BID) | ORAL | Status: DC

## 2024-07-26 MED ORDER — BOOST PLUS PO LIQD
237.0000 mL | Freq: Three times a day (TID) | ORAL | Status: DC
  Administered 2024-07-26 (×2): 237 mL via ORAL
  Filled 2024-07-26 (×8): qty 237

## 2024-07-26 MED ORDER — METOPROLOL TARTRATE 25 MG PO TABS
25.0000 mg | ORAL_TABLET | Freq: Two times a day (BID) | ORAL | Status: DC
  Administered 2024-07-26 – 2024-07-28 (×5): 25 mg via ORAL
  Filled 2024-07-26 (×5): qty 1

## 2024-07-26 MED ORDER — ASPIRIN 81 MG PO TBEC
81.0000 mg | DELAYED_RELEASE_TABLET | Freq: Every day | ORAL | Status: DC
  Administered 2024-07-27 – 2024-07-28 (×2): 81 mg via ORAL
  Filled 2024-07-26 (×2): qty 1

## 2024-07-26 MED ORDER — WARFARIN SODIUM 2.5 MG PO TABS
2.5000 mg | ORAL_TABLET | Freq: Every day | ORAL | Status: DC
  Administered 2024-07-26 – 2024-07-27 (×2): 2.5 mg via ORAL
  Filled 2024-07-26 (×2): qty 1

## 2024-07-26 NOTE — Plan of Care (Signed)
  Problem: Education: Goal: Knowledge of General Education information will improve Description: Including pain rating scale, medication(s)/side effects and non-pharmacologic comfort measures Outcome: Progressing   Problem: Health Behavior/Discharge Planning: Goal: Ability to manage health-related needs will improve Outcome: Progressing   Problem: Clinical Measurements: Goal: Ability to maintain clinical measurements within normal limits will improve Outcome: Progressing Goal: Will remain free from infection Outcome: Progressing Goal: Diagnostic test results will improve Outcome: Progressing Goal: Respiratory complications will improve Outcome: Progressing Goal: Cardiovascular complication will be avoided Outcome: Progressing   Problem: Activity: Goal: Risk for activity intolerance will decrease Outcome: Progressing   Problem: Nutrition: Goal: Adequate nutrition will be maintained Outcome: Progressing   Problem: Coping: Goal: Level of anxiety will decrease Outcome: Progressing   Problem: Elimination: Goal: Will not experience complications related to bowel motility Outcome: Progressing Goal: Will not experience complications related to urinary retention Outcome: Progressing   Problem: Pain Managment: Goal: General experience of comfort will improve and/or be controlled Outcome: Progressing   Problem: Safety: Goal: Ability to remain free from injury will improve Outcome: Progressing   Problem: Skin Integrity: Goal: Risk for impaired skin integrity will decrease Outcome: Progressing   Problem: Activity: Goal: Risk for activity intolerance will decrease Outcome: Progressing   Problem: Cardiac: Goal: Will achieve and/or maintain hemodynamic stability Outcome: Progressing   Problem: Clinical Measurements: Goal: Postoperative complications will be avoided or minimized Outcome: Progressing   Problem: Respiratory: Goal: Respiratory status will improve Outcome:  Progressing   Problem: Skin Integrity: Goal: Wound healing without signs and symptoms of infection Outcome: Progressing

## 2024-07-26 NOTE — Progress Notes (Addendum)
 [address], [place] [address]             [phone]  3 Days Post-Op Procedure(s) (LRB): AORTIC VALVE REPLACEMENT USING ON-X AORTIC VALVE SIZE (N/A) ASCENDING AORTA REPLACEMENT USING HEMASHIELD PLATINUM SINGLE SIDE-ARM GRAFT SIZE 30MMx10MM AND HEMASHIELD PLATINUM GRAFT (N/A) ECHOCARDIOGRAM, TRANSESOPHAGEAL, INTRAOPERATIVE (N/A) Subjective: Transferred from ICU yesterday.  Resting in bed, awake and alert. No new concerns.  On RA Walked 400' yesterday  Objective: Vital signs in last 24 hours: Temp:  [97.8 F (36.6 C)-99.4 F (37.4 C)] 98.4 F (36.9 C) (09/22 0429) Pulse Rate:  [77-89] 85 (09/22 0429) Cardiac Rhythm: Normal sinus rhythm;Bundle branch block (09/21 2004) Resp:  [14-25] 19 (09/22 0429) BP: (115-160)/(63-106) 160/79 (09/22 0429) SpO2:  [90 %-99 %] 94 % (09/22 0429) FiO2 (%):  [0 %] 0 % (09/21 1051)  Hemodynamic parameters for last 24 hours:    Intake/Output from previous day: 09/21 0701 - 09/22 0700 In: 133 [P.O.:120; I.V.:13] Out: 725 [Urine:625; Chest Tube:100] Intake/Output this shift: No intake/output data recorded.  General appearance: alert, cooperative, and no distress Neurologic: intact Heart: maintaining SR Lungs: normal work of breathing. Sats stable on RA. Breath sounds clear Abdomen: soft, no tenderness Extremities: no peripheral edema Wound: The Prevena dressing over the sternotomy incision is not compressed. Will remove today.   Lab Results: Recent Labs    07/25/24 0505 07/26/24 0356  WBC 11.1* 9.1  HGB 9.2* 9.5*  HCT 28.3* 29.2*  PLT 149* 189   BMET:  Recent Labs    07/25/24 0505 07/26/24 0356  NA 135 138  K 3.9 4.1  CL 103 105  CO2 26 24  GLUCOSE 117* 99  BUN 12 9  CREATININE 0.73 0.79  CALCIUM 7.7* 8.1*    PT/INR:  Recent Labs    07/26/24 0356  LABPROT 32.7*  INR 3.0*   ABG    Component Value Date/Time   PHART 7.333 (L) 07/23/2024 1912   HCO3 21.3 07/23/2024 1912   TCO2  22 07/23/2024 1912   ACIDBASEDEF 4.0 (H) 07/23/2024 1912   O2SAT 94 07/23/2024 1912   CBG (last 3)  Recent Labs    07/25/24 1941 07/25/24 2354 07/26/24 0329  GLUCAP 107* 108* 104*    Assessment/Plan: S/P Procedure(s) (LRB): AORTIC VALVE REPLACEMENT USING ON-X AORTIC VALVE SIZE (N/A) ASCENDING AORTA REPLACEMENT USING HEMASHIELD PLATINUM SINGLE SIDE-ARM GRAFT SIZE 30MMx10MM AND HEMASHIELD PLATINUM GRAFT (N/A) ECHOCARDIOGRAM, TRANSESOPHAGEAL, INTRAOPERATIVE (N/A)  -POD3 replacement of the ascending aorta and aortic valve (23 On-X mechanical) ascending aortic aneurysm and severe aortic stenosis.  BP trending up, increasing the metoprolol  to 25mg  BID today. Coumadin  dosed at 5mg -5mg -2.5mg . INR 3.0 today. Holding Coumadin  today and recheck INR at 3PM this afternoon. Decrease the ASA to 81mg . D/C the Prevena today, leave the mediastinal JP for now.    -NEURO- intact, pain controlled.   -HEME- mild expected ABL anemia. Hct trending up and Plt count normalized. Monitoring.   -ENDO- no h/o DM. No insulin  coverage required. Will stop the CBG's and SSI.   -PULM- On RA, stable sats. Will replace his IS.   - GI- tolerating PO's, BM yesterday.   -Disposition-Anticipate discharge to home with his wife in 1-2 days if INR stable.      LOS: 3 days    Myron G. Roddenberry, PA-C 07/26/2024  Doing well this morning. Has been ambulating without supplemental oxygen. INR jumped to 3.0 this morning, will recheck in  the afternoon.  Keep blake drain for now. Tolerating diet, having bowel movements.Remove  Prevena today.  Con Clunes, MD Cardiothoracic Surgery Pager: [phone]    Not clinically significant Obesity class III - BMI > 40 (severe obesity)

## 2024-07-26 NOTE — Progress Notes (Addendum)
 Discussed with pt and his wife sternal precautions, incision care, IS use, heart healthy diet, exercise guidelines and CRP II. Pt and wife had no questions. Referral for CRP II placed for [facility].   Augustin Sharps, RRT  [phone]

## 2024-07-26 NOTE — Progress Notes (Signed)
 CARDIAC REHAB PHASE I   PRE:  Rate/Rhythm: 85 SR    BP: sitting 146/76    SpO2: 95 RA  MODE:  Ambulation: 340 ft   POST:  Rate/Rhythm: 102 ST    BP: sitting 140/76     SpO2: 97 RA   Pt tolerated fairly well. Used RW, contact guard as unsteady on feet at times. Fatigued after walk, to recliner. Encouraged IS and x2 more walks. Would benefit from MT consult.  [phone]  Aliene Aris BS, ACSM-CEP 07/26/2024 3:27 PM

## 2024-07-27 ENCOUNTER — Ambulatory Visit: Admitting: Student

## 2024-07-27 LAB — CBC
HCT: 31.4 % — ABNORMAL LOW (ref 39.0–52.0)
Hemoglobin: 10.2 g/dL — ABNORMAL LOW (ref 13.0–17.0)
MCH: 27.4 pg (ref 26.0–34.0)
MCHC: 32.5 g/dL (ref 30.0–36.0)
MCV: 84.4 fL (ref 80.0–100.0)
Platelets: 249 K/uL (ref 150–400)
RBC: 3.72 MIL/uL — ABNORMAL LOW (ref 4.22–5.81)
RDW: 13.8 % (ref 11.5–15.5)
WBC: 10.3 K/uL (ref 4.0–10.5)
nRBC: 0 % (ref 0.0–0.2)

## 2024-07-27 LAB — BASIC METABOLIC PANEL WITH GFR
Anion gap: 9 (ref 5–15)
BUN: 11 mg/dL (ref 6–20)
CO2: 25 mmol/L (ref 22–32)
Calcium: 8.6 mg/dL — ABNORMAL LOW (ref 8.9–10.3)
Chloride: 105 mmol/L (ref 98–111)
Creatinine, Ser: 0.82 mg/dL (ref 0.61–1.24)
GFR, Estimated: >60 mL/min (ref >60)
Glucose, Bld: 104 mg/dL — ABNORMAL HIGH (ref 70–99)
Potassium: 3.8 mmol/L (ref 3.5–5.1)
Sodium: 139 mmol/L (ref 135–145)

## 2024-07-27 LAB — GLUCOSE, CAPILLARY
Glucose-Capillary: 107 mg/dL — ABNORMAL HIGH (ref 70–99)
Glucose-Capillary: 123 mg/dL — ABNORMAL HIGH (ref 70–99)
Glucose-Capillary: 105 mg/dL — ABNORMAL HIGH (ref 70–99)
Glucose-Capillary: 121 mg/dL — ABNORMAL HIGH (ref 70–99)
Glucose-Capillary: 112 mg/dL — ABNORMAL HIGH (ref 70–99)
Glucose-Capillary: 126 mg/dL — ABNORMAL HIGH (ref 70–99)

## 2024-07-27 LAB — PROTIME-INR
INR: 2 — ABNORMAL HIGH (ref 0.8–1.2)
Prothrombin Time: 23.8 s — ABNORMAL HIGH (ref 11.4–15.2)

## 2024-07-27 LAB — SURGICAL PATHOLOGY

## 2024-07-27 NOTE — Progress Notes (Signed)
 Seen pt to ambulate however pt seen walking in hallway w/o AD holding chest tube. Pt walked 471ft with x1 standing rest break, pt plans for wash-up after walk. HR after walk 125 ST.  Garen FORBES Candy MS, ACSM-CEP 07/27/2024 9:50 AM

## 2024-07-27 NOTE — Progress Notes (Addendum)
 [address] [place] [address]             [phone]    4 Days Post-Op Procedure(s) (LRB): AORTIC VALVE REPLACEMENT USING ON-X AORTIC VALVE SIZE (N/A) ASCENDING AORTA REPLACEMENT USING HEMASHIELD PLATINUM SINGLE SIDE-ARM GRAFT SIZE 30MMx10MM AND HEMASHIELD PLATINUM GRAFT (N/A) ECHOCARDIOGRAM, TRANSESOPHAGEAL, INTRAOPERATIVE (N/A) Subjective:  Up in the bedside chair, says he didn't est well last night. Otherwise no new concerns.  On RA Walked with the rehab team yesterday.  BM yesterday  Objective: Vital signs in last 24 hours: Temp:  [98.1 F (36.7 C)-99.2 F (37.3 C)] 98.5 F (36.9 C) (09/23 0321) Pulse Rate:  [71-95] 71 (09/23 0321) Cardiac Rhythm: Normal sinus rhythm;Bundle branch block (09/22 1900) Resp:  [18-20] 18 (09/23 0321) BP: (123-141)/(70-84) 133/80 (09/23 0321) SpO2:  [93 %-97 %] 97 % (09/23 0321) Weight:  [128.3 kg] 128.3 kg (09/23 0500)    Intake/Output from previous day: 09/22 0701 - 09/23 0700 In: 243 [P.O.:240; I.V.:3] Out: 0  Intake/Output this shift: Total I/O In: 3 [I.V.:3] Out: 0   General appearance: alert, cooperative, and no distress Neurologic: intact Heart:  SR with several episodes of sinus tach at 100-120's Lungs: normal work of breathing. Sats stable on RA. Breath sounds clear Abdomen: soft, no tenderness Extremities: no peripheral edema Wound: The sternotomy incision open to air, well approximated and dry.  Lab Results: Recent Labs    07/26/24 0356 07/27/24 0313  WBC 9.1 10.3  HGB 9.5* 10.2*  HCT 29.2* 31.4*  PLT 189 249   BMET:  Recent Labs    07/26/24 0356 07/27/24 0313  NA 138 139  K 4.1 3.8  CL 105 105  CO2 24 25  GLUCOSE 99 104*  BUN 9 11  CREATININE 0.79 0.82  CALCIUM 8.1* 8.6*    PT/INR:  Recent Labs    07/27/24 0313  LABPROT 23.8*  INR 2.0*   ABG    Component Value Date/Time   PHART 7.333 (L) 07/23/2024 1912   HCO3 21.3 07/23/2024 1912   TCO2 22  07/23/2024 1912   ACIDBASEDEF 4.0 (H) 07/23/2024 1912   O2SAT 94 07/23/2024 1912   CBG (last 3)  Recent Labs    07/26/24 1649 07/26/24 2113 07/27/24 0602  GLUCAP 101* 116* 107*    Assessment/Plan: S/P Procedure(s) (LRB): AORTIC VALVE REPLACEMENT USING ON-X AORTIC VALVE SIZE (N/A) ASCENDING AORTA REPLACEMENT USING HEMASHIELD PLATINUM SINGLE SIDE-ARM GRAFT SIZE 30MMx10MM AND HEMASHIELD PLATINUM GRAFT (N/A) ECHOCARDIOGRAM, TRANSESOPHAGEAL, INTRAOPERATIVE (N/A)  -POD4 replacement of the ascending aorta and aortic valve (23 On-X mechanical) for ascending aortic aneurysm and severe aortic stenosis.  BP stable with MAP 80-90 on metoprolol  25mg  BID. Coumadin  dosed at 5mg -5mg -2.5mg . INR 3.0 yesterday morning and was down to 2.0 by yesterday afternoon. Coumadin  resumed at 2.5mg  last evening.  INR 2.0 this morning. Continue current dosing.  No drainage recorded from the mediastinal JP.  Expect it can be removed today.   -NEURO- intact, pain controlled.   -HEME- mild expected ABL anemia. Hct continues trending up and Plt count normalized. Monitoring.   -ENDO- no h/o DM. No SS insulin  coverage required but he has been receiving Lantus  9u at bedtime. Will continue monitoring.  -PULM- On RA, stable sats. Encourage ambulation and IS.   - GI- tolerating PO's, BM yesterday.   -Disposition-Anticipate discharge to home with his wife in 1-2 days if INR stable.     LOS: 4 days  Jerry G. Roddenberry, PA-C 07/27/2024  Continues to progress well.  Remains in sinus rhythm, BP better controlled after increasing metoprolol .  Tolerating diet, having bowel function. Minimal chest tube output - will remove today.  INR this AM 2.0, will give another 2.5 mg coumadin  tonight.  Getting close to discharge pending coumadin  titration  Jerry Clunes, MD Cardiothoracic Surgery Pager: [phone]    [address] [place] [address]             [phone]

## 2024-07-27 NOTE — Plan of Care (Signed)
   Problem: Education: Goal: Knowledge of General Education information will improve Description Including pain rating scale, medication(s)/side effects and non-pharmacologic comfort measures Outcome: Progressing

## 2024-07-27 NOTE — Discharge Summary (Signed)
 Physician Discharge Summary  Patient ID: Jerry Parrish MRN: [id number] DOB/AGE: 1966-04-17 58 y.o.  Admit date: 07/23/2024 Discharge date: 07/28/2024  Admission Diagnoses:  Severe aortic stenosis Thoracic aortic aneurysm Obesity Anxiety disorder   Discharge Diagnoses:   Severe aortic stenosis Thoracic aortic aneurysm Obesity Anxiety disorder S/P AVR (aortic valve replacement) and repair of ascending aortic aneurysm Expected acute blood loss anemia and thrombocytopenia Supraventricular tachycardia  Discharged Condition: stable    History of Present Illness:     Jerry Parrish is a 58 year old man who presents for surgical evaluation of severe aortic stenosis and ascending aortic aneurysm.  He has known he had a murmur since 58 years old, but recently had a repeat ECHO which demonstrated severe aortic stenosis (AVA 0.84, MG 76, Vmax 5.98), normal biventricular function and no other valvular abnormalities.  R/LHC showed no significant CAD with midly elevated filling pressure and low normal CO/CI.  CT scan demonstrated an enlarged ascending aorta (4.4 cm) with a normal root.   He denies chest pain, but reports shortness of breath and some dizzy spells.  Over the summer he was out camping in hot weather, when he developed dizziness with leg swelling and a general sense of feeling awful all over.  He suspected it was heat stroke and it got better on its own.  He reports some leg swelling that worsens with walking.    He is a non-smoker and drinks alcohol rarely. He denies history of stroke or kidney issues.  He works as a Scientist, forensic.  Dr. Daniel reviewed the patient's diagnostic studies and determined he would benefit from surgical intervention. She reviewed the patient's treatment options as well as the risks and benefits of surgery with the patient. Jerry Parrish was agreeable to proceed with surgery.  Hospital Course: Jerry Parrish presented to [facility] and  underwent aortic valve replacement utilizing a 23mm On-X aortic valve and ascending aorta replacement utilizing a 61mmx10mm Hemashield platinum single side arm graft and a 26mm Hemashield platinum graft. He tolerated the procedure well and was transferred to the SICU in stable condition. Vital signs and hemodynamics remained stable. He was extubated by 6pm on the day of surgery.  The monitoring lines were removed on the first post-op day and he was mobilized routinely. Coumadin  anticoagulation was initiated on post-op day 1. He was transferred to 4E Progressive Care on post-op day 2.  He regained independence with mobility and the oxygen was weaned off. By the 3rd post-op day, the INR was 3.0 so the Coumadin  was held until later that evening when the INR had decreased to 2.0. The mediastinal drain was removed. Warfarin was resumed at 2.5mg  daily with the INR stabilizing at 2.0.  blood pressure trended up with MAP consistently in the 90's by post-op day 5 and he was also having some episodes of SVT in the 120's The metoprolol  was increased to 50mg  BID. He diuresed well after surgery and had no evidence of volume excess at the time of discharge. Incisions were intact and healing with no sign of complication.    Consults: pulmonary/intensive care  Significant Diagnostic Studies:   CLINICAL DATA:  Status post aortic valve replacement.   EXAM: CHEST - 2 VIEW   COMPARISON:  07/25/2024   FINDINGS: Stable enlarged cardiac silhouette. Minimal bibasilar linear atelectasis. Otherwise, clear lungs with normal vascularity. Stable median sternotomy wires and faintly visualized prosthetic aortic valve. No acute bony abnormality.   IMPRESSION: 1. Minimal bibasilar linear atelectasis. 2. Stable  cardiomegaly.     Electronically Signed   By: Elspeth Bathe M.D.   On: 07/26/2024 14:05  Treatments:  Surgery   07/23/2024   Surgeon:  Con Clunes, MD   First Assistant: Dorise Fellers, MD     Preoperative  Diagnosis:  Severe aortic stenosis, 4.5 cm ascending aortic aneurysm     Postoperative Diagnosis:  Same     Procedure:   Median Sternotomy Extracorporeal circulation 3.   Replacement of ascending aorta (hemi-arch) using a 30 mm Hemashiel graft under deep hypothermic circulatory arrest 4.  Aortic valve replacement with 23 mm On-X mechanical valve   Anesthesia:  General Endotracheal   Clinical History/Surgical Indication:   Jerry Parrish is a healthy 58 year old man with history of bicuspid valve and severe aortic stenosis with ascending aortic aneurysm. He has worsening symptoms of leg swelling and dyspnea, but continues to be very functional and active. The quality of his CTA chest is poor with motion artifact, but on my own measurement, as well as that of a cardiology imager, the aorta measures at least 4.3 x 4.4 cm. Guidelines recommend aortic replacement for patients with bicuspid valve having surgery with an aneurysm > 4.5 cm. Given his young age and desire to avoid a second heart operation, we will replace his ascending aorta at the time of valve replacement.     Discharge Exam: Blood pressure 133/74, pulse 76, temperature 97.9 F (36.6 C), temperature source Oral, resp. rate 16, height 5' 11 (1.803 m), weight 127.9 kg, SpO2 94%.  General appearance: alert, cooperative, and no distress Neurologic: intact Heart:  SR with a few episodes of sinus tach at 100-120's Lungs: normal work of breathing. Sats stable on RA. Breath sounds clear Abdomen: soft, no tenderness Extremities: no peripheral edema Wound: The sternotomy incision remains well approximated and dry.  Disposition:  Discharged to home in stable condition  Discharge Instructions     Amb Referral to Cardiac Rehabilitation   Complete by: As directed    Diagnosis: Valve Replacement   Valve: Aortic   After initial evaluation and assessments completed: Virtual Based Care may be provided alone or in conjunction with Phase 2  Cardiac Rehab based on patient barriers.: Yes   Intensive Cardiac Rehabilitation (ICR) [facility] location only OR Traditional Cardiac Rehabilitation (TCR) *If criteria for ICR are not met will enroll in TCR ([facility] only): Yes      Allergies as of 07/28/2024   No Known Allergies      Medication List     TAKE these medications    acetaminophen  325 MG tablet Commonly known as: TYLENOL  Take 2 tablets (650 mg total) by mouth every 6 (six) hours as needed.   aspirin  EC 81 MG tablet Take 1 tablet (81 mg total) by mouth daily. Swallow whole. Start taking on: July 29, 2024   cyclobenzaprine  10 MG tablet Commonly known as: FLEXERIL  Take 10 mg by mouth 3 (three) times daily as needed for muscle spasms.   metoprolol  tartrate 50 MG tablet Commonly known as: LOPRESSOR  Take 1 tablet (50 mg total) by mouth 2 (two) times daily.   omeprazole 20 MG capsule Commonly known as: PRILOSEC Take 20 mg by mouth daily. What changed:  when to take this reasons to take this   oxyCODONE  5 MG immediate release tablet Commonly known as: Oxy IR/ROXICODONE  Take 1 tablet (5 mg total) by mouth every 6 (six) hours as needed for up to 7 days for severe pain (pain score 7-10).   PARoxetine   10 MG tablet Commonly known as: PAXIL  Take 10 mg by mouth every morning.   warfarin 2.5 MG tablet Commonly known as: COUMADIN  Take 1 tablet (2.5 mg total) by mouth at bedtime. Or as instructed by the Coumadin  Clinic.        Follow-up Information     Su, Con RAMAN, MD. Go on 08/05/2024.   Specialty: Cardiothoracic Surgery Why: Your appointment is at 11:30am. Please arrive about 45 minutes early for a chest x-ray to be performed on the 2nd floor of the same building. Contact information: [address]th Floor [place] [address] [phone]         [facility] A Dept of [employer]. Go on 08/02/2024.   Specialty: Cardiology Why: Your appointment for the Coumadin  Clinic is at  10:30am. Contact information: [address] [place]  [address] [phone]        Darliss Rogue, MD Follow up.   Specialties: Cardiology, Radiology Why: Your cardiology follow up appointment has been requested.  Please follow your MyChart account for details regarding date and time. Contact information: [address] [place] [address] [phone]                 The patient has been discharged on:   1.Beta Blocker:  Yes [ x  ]                              No   [   ]                              If No, reason:  2.Ace Inhibitor/ARB: Yes [   ]                                     No  [  x  ]                                     If No, reason: titrating beta blocker for HR and BP.   3.Statin:   Yes [ x  ]                  No  [   ]                  If No, reason:  4.Ecasa:  Yes  [ x ]                  No   [   ]                  If No, reason:  5. ACS on Admission?  No  P2Y12 Inhibitor:  Yes  [   ]                                No  [ x ]    Signed: Laurel JUDITHANN Becket, PA-C 07/28/2024, 12:42 PM

## 2024-07-28 LAB — GLUCOSE, CAPILLARY
Glucose-Capillary: 88 mg/dL (ref 70–99)
Glucose-Capillary: 91 mg/dL (ref 70–99)

## 2024-07-28 LAB — PROTIME-INR
INR: 2 — ABNORMAL HIGH (ref 0.8–1.2)
Prothrombin Time: 23.4 s — ABNORMAL HIGH (ref 11.4–15.2)

## 2024-07-28 MED ORDER — WARFARIN SODIUM 2.5 MG PO TABS: 2.5000 mg | ORAL_TABLET | Freq: Every day | ORAL | 6 refills | Status: DC

## 2024-07-28 MED ORDER — OXYCODONE HCL 5 MG PO TABS: 5.0000 mg | ORAL_TABLET | Freq: Four times a day (QID) | ORAL | 0 refills | Status: AC | PRN

## 2024-07-28 MED ORDER — METOPROLOL TARTRATE 50 MG PO TABS: 50.0000 mg | ORAL_TABLET | Freq: Two times a day (BID) | ORAL | 2 refills | Status: DC

## 2024-07-28 MED ORDER — ASPIRIN 81 MG PO TBEC: 81.0000 mg | DELAYED_RELEASE_TABLET | Freq: Every day | ORAL | 12 refills | Status: DC

## 2024-07-28 MED ORDER — ACETAMINOPHEN 325 MG PO TABS: 650.0000 mg | ORAL_TABLET | Freq: Four times a day (QID) | ORAL | Status: DC | PRN

## 2024-07-28 MED FILL — Heparin Sodium (Porcine) Inj 1000 Unit/ML: Qty: 1000 | Status: AC

## 2024-07-28 MED FILL — Electrolyte-R (PH 7.4) Solution: INTRAVENOUS | Qty: 5000 | Status: AC

## 2024-07-28 MED FILL — Lidocaine HCl Local Preservative Free (PF) Inj 2%: INTRAMUSCULAR | Qty: 14 | Status: AC

## 2024-07-28 MED FILL — Sodium Bicarbonate IV Soln 8.4%: INTRAVENOUS | Qty: 50 | Status: AC

## 2024-07-28 MED FILL — Mannitol IV Soln 20%: INTRAVENOUS | Qty: 500 | Status: AC

## 2024-07-28 MED FILL — Heparin Sodium (Porcine) Inj 1000 Unit/ML: INTRAMUSCULAR | Qty: 30 | Status: AC

## 2024-07-28 MED FILL — Sodium Chloride IV Soln 0.9%: INTRAVENOUS | Qty: 2000 | Status: AC

## 2024-07-28 MED FILL — Potassium Chloride Inj 2 mEq/ML: INTRAVENOUS | Qty: 40 | Status: AC

## 2024-07-28 NOTE — Progress Notes (Signed)
   [address] [place] [employer] [address]             [phone]    5 Days Post-Op Procedure(s) (LRB): AORTIC VALVE REPLACEMENT USING ON-X AORTIC VALVE SIZE (N/A) ASCENDING AORTA REPLACEMENT USING HEMASHIELD PLATINUM SINGLE SIDE-ARM GRAFT SIZE 30MMx10MM AND HEMASHIELD PLATINUM GRAFT (N/A) ECHOCARDIOGRAM, TRANSESOPHAGEAL, INTRAOPERATIVE (N/A) Subjective:  Feels good, no complaints.   Would like to return home.   Objective: Vital signs in last 24 hours: Temp:  [98.2 F (36.8 C)-99 F (37.2 C)] 98.2 F (36.8 C) (09/24 0413) Pulse Rate:  [68-90] 72 (09/24 0413) Cardiac Rhythm: Normal sinus rhythm (09/24 0340) Resp:  [19-23] 19 (09/24 0413) BP: (103-139)/(62-96) 138/75 (09/24 0413) SpO2:  [97 %-100 %] 98 % (09/24 0413) Weight:  [127.9 kg] 127.9 kg (09/24 0626)    Intake/Output from previous day: 09/23 0701 - 09/24 0700 In: 240 [P.O.:240] Out: -  Intake/Output this shift: No intake/output data recorded.  General appearance: alert, cooperative, and no distress Neurologic: intact Heart:  SR with a few episodes of sinus tach at 100-120's Lungs: normal work of breathing. Sats stable on RA. Breath sounds clear Abdomen: soft, no tenderness Extremities: no peripheral edema Wound: The sternotomy incision remains well approximated and dry.  Lab Results: Recent Labs    07/26/24 0356 07/27/24 0313  WBC 9.1 10.3  HGB 9.5* 10.2*  HCT 29.2* 31.4*  PLT 189 249   BMET:  Recent Labs    07/26/24 0356 07/27/24 0313  NA 138 139  K 4.1 3.8  CL 105 105  CO2 24 25  GLUCOSE 99 104*  BUN 9 11  CREATININE 0.79 0.82  CALCIUM 8.1* 8.6*    PT/INR:  Recent Labs    07/28/24 0300  LABPROT 23.4*  INR 2.0*   ABG    Component Value Date/Time   PHART 7.333 (L) 07/23/2024 1912   HCO3 21.3 07/23/2024 1912   TCO2 22 07/23/2024 1912   ACIDBASEDEF 4.0 (H) 07/23/2024 1912   O2SAT 94 07/23/2024 1912   CBG (last 3)  Recent Labs    07/27/24 1725  07/27/24 2106 07/28/24 0625  GLUCAP 112* 126* 88    Assessment/Plan: S/P Procedure(s) (LRB): AORTIC VALVE REPLACEMENT USING ON-X AORTIC VALVE SIZE (N/A) ASCENDING AORTA REPLACEMENT USING HEMASHIELD PLATINUM SINGLE SIDE-ARM GRAFT SIZE 30MMx10MM AND HEMASHIELD PLATINUM GRAFT (N/A) ECHOCARDIOGRAM, TRANSESOPHAGEAL, INTRAOPERATIVE (N/A)  -POD5 replacement of the ascending aorta and aortic valve (23 On-X mechanical) for ascending aortic aneurysm and severe aortic stenosis.  BP stable with MAP 90's most of the day on metoprolol  25mg  BID.   INR stable at 2.0 ton Coumadin  2.5mg  past 2 days.   -NEURO- intact, pain controlled.   -HEME- mild expected ABL anemia. Hct and Plt count have been stable. No new lab today.  -ENDO- no h/o DM with pre-op HA1C 4.8. On  Lantus  9u at bedtime. CBG's 88-120.   -PULM- On RA, stable sats. Encourage ambulation and IS.   - GI- tolerating PO's, appropriate bowel function.  -Disposition-Anticipate discharge to home today. Would tolerate increase in beta blocker, does not appear to require any further diuresis.      LOS: 5 days    Jerry Parrish G. Matther Labell, PA-C 07/28/2024

## 2024-07-28 NOTE — TOC Transition Note (Signed)
 Transition of Care (TOC) - Discharge Note Rayfield Gobble RN, BSN Inpatient Care Management Unit 4E- RN Case Manager See Treatment Team for direct phone #   Patient Details  Name: Jerry Parrish MRN: [id number] Date of Birth: 07/17/1966  Transition of Care [facility]) CM/SW Contact:  Gobble Rayfield Hurst, RN Phone Number: 07/28/2024, 2:54 PM   Clinical Narrative:    Pt stable for transition home today, wife to transport home.   CM notified by [facility] liaison that TCTS office made referral for [facility] needs, liaison to follow up with pt for [facility] nursing to monitor HR/BP and medication management.   No further INPT CM needs noted.   Final next level of care: Home w Home Health Services Barriers to Discharge: No Barriers Identified   Patient Goals and CMS Choice Patient states their goals for this hospitalization and ongoing recovery are:: return home          Discharge Placement               Home w/ [facility]        Discharge Plan and Services Additional resources added to the After Visit Summary for     Discharge Planning Services: CM Consult            DME Arranged: N/A DME Agency: NA         HH Agency: [facility] Date HH Agency Contacted: 07/28/24 Time HH Agency Contacted: 1000 Representative spoke with at [facility] Agency: [place]  Social Drivers of Health (SDOH) Interventions SDOH Screenings   Food Insecurity: No Food Insecurity (07/24/2024)  Housing: Low Risk  (07/24/2024)  Transportation Needs: No Transportation Needs (07/24/2024)  Utilities: Not At Risk (07/24/2024)  Tobacco Use: Unknown (07/23/2024)     Readmission Risk Interventions    07/28/2024    2:54 PM  Readmission Risk Prevention Plan  Post Dischage Appt Complete  Medication Screening Complete

## 2024-07-28 NOTE — Progress Notes (Signed)
 Revisited pt to reinforce AVR education including sternal precautions, exercise recommendations, and incentive spirometry use.  Pt demonstrated some verbal return demonstration of the above prior to reinforcement.    Cardiac Rehab Phase One  RODGER Music, RN BSN [phone]

## 2024-07-29 ENCOUNTER — Telehealth (HOSPITAL_COMMUNITY): Payer: Self-pay

## 2024-07-29 ENCOUNTER — Other Ambulatory Visit: Payer: Self-pay

## 2024-07-29 DIAGNOSIS — I7121 Aneurysm of the ascending aorta, without rupture: Secondary | ICD-10-CM

## 2024-07-29 DIAGNOSIS — I350 Nonrheumatic aortic (valve) stenosis: Secondary | ICD-10-CM

## 2024-07-29 DIAGNOSIS — Z48812 Encounter for surgical aftercare following surgery on the circulatory system: Secondary | ICD-10-CM | POA: Diagnosis not present

## 2024-07-29 NOTE — Telephone Encounter (Signed)
 Referral recv'd and verified for MD signature. Follow up appointment is on 10/14. Insurance benefits and eligibility TBD.

## 2024-07-29 NOTE — Telephone Encounter (Signed)
 Pt is indecisive if he wants to participate in the cardiac rehab program. Will mail pt brochure about the program. I adv pt that he has up to a year from his event to participate.   Closed referral.

## 2024-07-31 LAB — PROTIME-INR
INR: 1.4 — ABNORMAL HIGH (ref 0.9–1.2)
Prothrombin Time: 14.5 s — ABNORMAL HIGH (ref 9.1–12.0)

## 2024-08-02 ENCOUNTER — Ambulatory Visit: Attending: Internal Medicine | Admitting: Pharmacist

## 2024-08-02 DIAGNOSIS — I359 Nonrheumatic aortic valve disorder, unspecified: Secondary | ICD-10-CM | POA: Diagnosis not present

## 2024-08-02 DIAGNOSIS — Z952 Presence of prosthetic heart valve: Secondary | ICD-10-CM

## 2024-08-02 DIAGNOSIS — Z7901 Long term (current) use of anticoagulants: Secondary | ICD-10-CM | POA: Insufficient documentation

## 2024-08-02 LAB — POCT INR: INR: 2.5 (ref 2.0–3.0)

## 2024-08-02 NOTE — Progress Notes (Signed)
 Description   INR 2.5.  Continue taking 1 tablet daily. Please call Coumadin  clinic with any medication or diet changes.  [phone].    A full discussion of the nature of anticoagulants has been carried out.  A benefit risk analysis has been presented to the patient, so that they understand the justification for choosing anticoagulation at this time. The need for frequent and regular monitoring, precise dosage adjustment and compliance is stressed.  Side effects of potential bleeding are discussed.  The patient should avoid any OTC items containing aspirin  or ibuprofen, and should avoid great swings in general diet.  Avoid alcohol consumption.  Call if any signs of abnormal bleeding.  Next PT/INR test in 1 week; telephone follow up thereafter.

## 2024-08-02 NOTE — Patient Instructions (Signed)
 Description   INR 2.5.  Continue taking 1 tablet daily. Please call Coumadin  clinic with any medication or diet changes.  [phone].    A full discussion of the nature of anticoagulants has been carried out.  A benefit risk analysis has been presented to the patient, so that they understand the justification for choosing anticoagulation at this time. The need for frequent and regular monitoring, precise dosage adjustment and compliance is stressed.  Side effects of potential bleeding are discussed.  The patient should avoid any OTC items containing aspirin  or ibuprofen, and should avoid great swings in general diet.  Avoid alcohol consumption.  Call if any signs of abnormal bleeding.  Next PT/INR test in 1 week; telephone follow up thereafter.

## 2024-08-03 ENCOUNTER — Ambulatory Visit: Admitting: Cardiology

## 2024-08-04 ENCOUNTER — Other Ambulatory Visit: Payer: Self-pay

## 2024-08-04 DIAGNOSIS — I7121 Aneurysm of the ascending aorta, without rupture: Secondary | ICD-10-CM

## 2024-08-05 ENCOUNTER — Ambulatory Visit: Payer: Self-pay

## 2024-08-05 ENCOUNTER — Ambulatory Visit (HOSPITAL_COMMUNITY)
Admission: RE | Admit: 2024-08-05 | Discharge: 2024-08-05 | Disposition: A | Discharge status: Home / Self Care | Source: Ambulatory Visit | Attending: Cardiology | Admitting: Cardiology

## 2024-08-05 VITALS — BP 119/76 | HR 90 | Resp 18 | Ht 71.0 in | Wt 280.0 lb

## 2024-08-05 DIAGNOSIS — I350 Nonrheumatic aortic (valve) stenosis: Secondary | ICD-10-CM

## 2024-08-05 DIAGNOSIS — I7121 Aneurysm of the ascending aorta, without rupture: Secondary | ICD-10-CM | POA: Insufficient documentation

## 2024-08-05 DIAGNOSIS — Z952 Presence of prosthetic heart valve: Secondary | ICD-10-CM

## 2024-08-05 NOTE — Progress Notes (Signed)
 [address] [place] [address]             [phone]       HPI:  Patient returns for routine postoperative follow-up having undergone mechanical Wheat procedure (AVR with ascending/hemiarch replacement) on 07/23/24.  He was discharged on on POD 5, he is now POD 13. The patient's early postoperative recovery progressed as expected.  As an outpatient his INR dropped to 1.4 but his Coumadin  dosing has been adjusted and he is doing well.   Since hospital discharge the patient reports doing well.  He is not taking pain medicine, he is having normal bowel function, and is walking daily. He is working on his incentive spirometer daily and feels well.  He denies any leg swelling or weight gain. Weight today is 280, was 282 at discharge.  Pathology: FINAL MICROSCOPIC DIAGNOSIS:   A. ANEURYSM, ASCENDING AORTIC:  Artery wall with degenerative changes and hemorrhage consistent with  aneurysm.   B. HEART VALVE LEAFLETS, AORTIC:  Heart valve leaflets with calcifications and fibrosis.   Allergies as of 08/05/2024   No Known Allergies      Medication List        Accurate as of August 05, 2024 11:31 AM. If you have any questions, ask your nurse or doctor.          acetaminophen  325 MG tablet Commonly known as: TYLENOL  Take 2 tablets (650 mg total) by mouth every 6 (six) hours as needed.   aspirin  EC 81 MG tablet Take 1 tablet (81 mg total) by mouth daily. Swallow whole.   cyclobenzaprine  10 MG tablet Commonly known as: FLEXERIL  Take 10 mg by mouth 3 (three) times daily as needed for muscle spasms.   metoprolol  tartrate 50 MG tablet Commonly known as: LOPRESSOR  Take 1 tablet (50 mg total) by mouth 2 (two) times daily.   omeprazole 20 MG capsule Commonly known as: PRILOSEC Take 20 mg by mouth daily. What changed:  when to take this reasons to take this   PARoxetine  10 MG tablet Commonly known as: PAXIL  Take 10 mg by mouth every morning.    warfarin 2.5 MG tablet Commonly known as: COUMADIN  Take as directed by the anticoagulation clinic. If you are unsure how to take this medication, talk to your nurse or doctor. Original instructions: Take 1 tablet (2.5 mg total) by mouth at bedtime. Or as instructed by the Coumadin  Clinic.        BP 119/76   Pulse 90   Resp 18   Ht 5' 11 (1.803 m)   Wt 280 lb (127 kg)   SpO2 96% Comment: RA  BMI 39.05 kg/m   Physical Exam: General - Appears well, good coloring CV - RRR; + murmur; sternotomy incision CDI Resp - Unlabored on RA, breath sounds clear; chest tube stitches removed Abd - Soft, ND/NT Ext - No edema    Imaging: CXR - Clear lung fields. No pleural effusion or pneumothorax.   Assessment/Plan: Jerry Parrish is a 58 year old man  mechanical Wheat procedure (AVR with ascending/hemiarch replacement) on 07/23/24.  He was discharged on on POD 5, he is now POD 13.  He has been doing well at home.  CXR looks great, remains in NSR with good BP today. No edema and appetite is improving.  Still no driving or heavy lifting - Return to clinic in 2 weeks - Reminded to shower daily   Jerry Parrish  Jerry Volpe, MD 11:31 AM 08/05/24

## 2024-08-10 ENCOUNTER — Ambulatory Visit: Attending: Cardiology | Admitting: *Deleted

## 2024-08-10 DIAGNOSIS — I359 Nonrheumatic aortic valve disorder, unspecified: Secondary | ICD-10-CM

## 2024-08-10 DIAGNOSIS — Z7901 Long term (current) use of anticoagulants: Secondary | ICD-10-CM | POA: Diagnosis not present

## 2024-08-10 DIAGNOSIS — Z952 Presence of prosthetic heart valve: Secondary | ICD-10-CM | POA: Diagnosis not present

## 2024-08-10 LAB — POCT INR: INR: 2 (ref 2.0–3.0)

## 2024-08-10 NOTE — Progress Notes (Signed)
 Description   INR 2.0; Continue taking warfarin 1 tablet daily. Please call Coumadin  clinic with any medication or diet changes.  Anticoagulation Clinic [phone].

## 2024-08-10 NOTE — Patient Instructions (Signed)
 Description   INR 2.0; Continue taking warfarin 1 tablet daily. Please call Coumadin  clinic with any medication or diet changes.  Anticoagulation Clinic [phone].

## 2024-08-15 NOTE — Progress Notes (Unsigned)
 Cardiology Clinic Note   Date: 08/17/2024 ID: Jerry Parrish, DOB 1966/02/21, MRN [id number]  Primary Cardiologist:  Redell Cave, MD  Chief Complaint   Jerry Parrish is a 58 y.o. male who presents to the clinic today for hospital follow up.   Patient Profile   Jerry Parrish is followed by Dr. Cave for the history outlined below.       Past medical history significant for: Aortic stenosis/ascending aortic aneurysm. Echo 07/02/2024: EF 55 to 60%.  No RWMA.  Moderate LVH.  Grade I DD.  Normal RV size/function.  Moderate LAE.  Mild to moderate MR.  Mild AI.  Aortic valve with indeterminate number of cusps, consider bicuspid valve.  Severe aortic stenosis, mean gradient 76 mmHg, valve area 0.84 cm. R/LHC 07/09/2024: No angiographically significant CAD.  Mildly elevated left heart, right heart, and pulmonary artery pressures.  Low normal to mildly reduced Fick cardiac output/index. 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 GERD.  In summary, patient was first seen by Dr. Cave on 05/31/2024 for shortness of breath. He reported being diagnosed with bicuspid aortic valve >15 years ago. He underwent echo which demonstrated severe aortic stenosis.    Patient was last seen in the office by me on 07/02/2024 when he was urgently added onto the schedule after echo.  Patient denied shortness of breath, lower extremity edema, orthopnea, PND, chest pain, palpitations.  He did report recent ankle edema after camping that resolved upon returning home.  He was scheduled for [facility] which demonstrated no angiographically significant CAD and mildly elevated left heart, right heart and pulmonary artery pressures.  He was referred to CT surgery for further evaluation of aortic stenosis.  He had a CT scan that demonstrated aortic aneurysm measuring 4.3 x 4.4 cm.  He underwent aortic valve replacement and ascending aortic aneurysm repair on 07/23/2024.     History  of Present Illness    Today, patient is here alone. He reports doing well since AVR. On Sunday 10/5 he developed a fever of 100.8. He took Tylenol  and it came down. This week he had an episode of feeling like it was harder to breathe. He did not have a fever at that time. He took some Tums and the sensation passed. Midline incision is healing well with no redness, swelling, drainage or tenderness. He has felt a little bit of pulling on his left side and attributes that to healing. He denies chest pain, shortness of breath, lower extremity edema. He denies blood in stool or urine. He does have a history of hemorrhoids and noted blood one time on toilet tissue after a bowel movement. He is not walking as much as I should. He would like to transition his care to [place], as our [facility] location is more convenient for him.     ROS: All other systems reviewed and are otherwise negative except as noted in History of Present Illness.  EKGs/Labs Reviewed       EKG not performed today.   07/22/2024: ALT 24; AST 22 07/27/2024: BUN 11; Creatinine, Ser 0.82; Potassium 3.8; Sodium 139   07/27/2024: Hemoglobin 10.2; WBC 10.3    Physical Exam    VS:  BP 126/72   Pulse 84   Ht 5' 11 (1.803 m)   Wt 277 lb 3.2 oz (125.7 kg)   SpO2 94%   BMI 38.66 kg/m  , BMI Body mass index is 38.66 kg/m.  GEN: Well nourished, well developed, in no acute  distress. Neck: No JVD or carotid bruits. Cardiac:  RRR.  No murmur. No rubs or gallops.   Respiratory:  Respirations regular and unlabored. Clear to auscultation without rales, wheezing or rhonchi. GI: Soft, nontender, nondistended. Extremities: Radials/DP/PT 2+ and equal bilaterally. No clubbing or cyanosis. No edema   Skin: Warm and dry, no rash. Midline sternal incision without erythema, edema, drainage, or tenderness.  Neuro: Strength intact.  Assessment & Plan   Aortic stenosis/ascending aortic aneurysm S/p AVR with mechanical valve/ascending  aortic aneurysm repair 07/23/2024.  Denies spontaneous bleeding concerns.  He has a history of hemorrhoids and noted blood on toilet tissue after bowel movement one time. He developed a fever or 100.8 on 10/5 for which he took Tylenol . No recurrence of fever since. He had one episode of feeling like it was hard to breathe that resolved with Tums. Midline incision is well approximated without erythema, edema, drainage or tenderness.  - Continue Lopressor , aspirin , Coumadin . - Schedule echo. - BMP, CBC today.   Disposition: BMP and CBC today. Schedule echo. Return in 3 months or sooner as needed.  Patient would like to transition care to [facility], as the [facility] is a more convenient location for him. Will transfer care to Dr. Anner who is DOD today.          Signed, Barnie HERO. Jasin Brazel, DNP, NP-C

## 2024-08-17 ENCOUNTER — Encounter: Payer: Self-pay | Admitting: Student

## 2024-08-17 ENCOUNTER — Ambulatory Visit: Attending: Student | Admitting: Student

## 2024-08-17 VITALS — BP 126/72 | HR 84 | Ht 71.0 in | Wt 277.2 lb

## 2024-08-17 DIAGNOSIS — Z79899 Other long term (current) drug therapy: Secondary | ICD-10-CM | POA: Diagnosis not present

## 2024-08-17 DIAGNOSIS — I7121 Aneurysm of the ascending aorta, without rupture: Secondary | ICD-10-CM

## 2024-08-17 DIAGNOSIS — I350 Nonrheumatic aortic (valve) stenosis: Secondary | ICD-10-CM | POA: Diagnosis not present

## 2024-08-17 DIAGNOSIS — Z9889 Other specified postprocedural states: Secondary | ICD-10-CM

## 2024-08-17 DIAGNOSIS — Z952 Presence of prosthetic heart valve: Secondary | ICD-10-CM

## 2024-08-17 DIAGNOSIS — Z8679 Personal history of other diseases of the circulatory system: Secondary | ICD-10-CM

## 2024-08-17 NOTE — Patient Instructions (Signed)
 Medication Instructions:   Your physician recommends that you continue on your current medications as directed. Please refer to the Current Medication list given to you today.    *If you need a refill on your cardiac medications before your next appointment, please call your pharmacy*  Lab Work:  Your provider would like for you to have following labs drawn today BMet, CBC.    If you have labs (blood work) drawn today and your tests are completely normal, you will receive your results only by:  MyChart Message (if you have MyChart) OR  A paper copy in the mail If you have any lab test that is abnormal or we need to change your treatment, we will call you to review the results.  Testing/Procedures:  Your physician has requested that you have an echocardiogram. Echocardiography is a painless test that uses sound waves to create images of your heart. It provides your doctor with information about the size and shape of your heart and how well your heart's chambers and valves are working.   You may receive an ultrasound enhancing agent through an IV if needed to better visualize your heart during the echo. This procedure takes approximately one hour.  There are no restrictions for this procedure.  This will take place at [facility] Address: [address], [place], [place] [address] Phone: [phone]  Please note: We ask at that you not bring children with you during ultrasound (echo/ vascular) testing. Due to room size and safety concerns, children are not allowed in the ultrasound rooms during exams. Our front office staff cannot provide observation of children in our lobby area while testing is being conducted. An adult accompanying a patient to their appointment will only be allowed in the ultrasound room at the discretion of the ultrasound technician under special circumstances. We apologize for any inconvenience.   Referrals:  None ordered  at this time   Follow-Up:  At [facility], you and your health needs are our priority.  As part of our continuing mission to provide you with exceptional heart care, our providers are all part of one team.  This team includes your primary Cardiologist (physician) and Advanced Practice Providers or APPs (Physician Assistants and Nurse Practitioners) who all work together to provide you with the care you need, when you need it.  Your next appointment:   3 month(s)  Provider: Dr. Anner    We recommend signing up for the patient portal called MyChart.  Sign up information is provided on this After Visit Summary.  MyChart is used to connect with patients for Virtual Visits (Telemedicine).  Patients are able to view lab/test results, encounter notes, upcoming appointments, etc.  Non-urgent messages can be sent to your provider as well.   To learn more about what you can do with MyChart, go to [email or web address].   Other Instructions

## 2024-08-18 ENCOUNTER — Ambulatory Visit: Payer: Self-pay | Admitting: Student

## 2024-08-18 LAB — CBC
Hematocrit: 37 % — ABNORMAL LOW (ref 37.5–51.0)
Hemoglobin: 11.4 g/dL — ABNORMAL LOW (ref 13.0–17.7)
MCH: 25.7 pg — ABNORMAL LOW (ref 26.6–33.0)
MCHC: 30.8 g/dL — ABNORMAL LOW (ref 31.5–35.7)
MCV: 84 fL (ref 79–97)
Platelets: 450 x10E3/uL (ref 150–450)
RBC: 4.43 x10E6/uL (ref 4.14–5.80)
RDW: 13.1 % (ref 11.6–15.4)
WBC: 8.6 x10E3/uL (ref 3.4–10.8)

## 2024-08-18 LAB — BASIC METABOLIC PANEL WITH GFR
BUN/Creatinine Ratio: 12 (ref 9–20)
BUN: 12 mg/dL (ref 6–24)
CO2: 20 mmol/L (ref 20–29)
Calcium: 9 mg/dL (ref 8.7–10.2)
Chloride: 101 mmol/L (ref 96–106)
Creatinine, Ser: 0.97 mg/dL (ref 0.76–1.27)
Glucose: 102 mg/dL — ABNORMAL HIGH (ref 70–99)
Potassium: 4.1 mmol/L (ref 3.5–5.2)
Sodium: 138 mmol/L (ref 134–144)
eGFR: 91 mL/min/1.73 (ref >59)

## 2024-08-19 ENCOUNTER — Telehealth (HOSPITAL_COMMUNITY): Payer: Self-pay

## 2024-08-19 ENCOUNTER — Ambulatory Visit: Attending: Cardiology | Admitting: *Deleted

## 2024-08-19 ENCOUNTER — Ambulatory Visit: Payer: Self-pay

## 2024-08-19 VITALS — BP 127/77 | HR 73 | Resp 18 | Ht 71.0 in | Wt 277.0 lb

## 2024-08-19 DIAGNOSIS — Z952 Presence of prosthetic heart valve: Secondary | ICD-10-CM

## 2024-08-19 DIAGNOSIS — Z7901 Long term (current) use of anticoagulants: Secondary | ICD-10-CM

## 2024-08-19 DIAGNOSIS — I350 Nonrheumatic aortic (valve) stenosis: Secondary | ICD-10-CM

## 2024-08-19 DIAGNOSIS — Z510 Encounter for antineoplastic radiation therapy: Secondary | ICD-10-CM | POA: Diagnosis not present

## 2024-08-19 DIAGNOSIS — I7121 Aneurysm of the ascending aorta, without rupture: Secondary | ICD-10-CM

## 2024-08-19 DIAGNOSIS — I359 Nonrheumatic aortic valve disorder, unspecified: Secondary | ICD-10-CM

## 2024-08-19 LAB — POCT INR: POC INR: 2.4

## 2024-08-19 NOTE — Patient Instructions (Signed)
 Description   INR 2.4;  Continue taking warfarin 1 tablet daily. Please call Coumadin  clinic with any medication or diet changes.  Anticoagulation Clinic [phone].

## 2024-08-19 NOTE — Progress Notes (Signed)
 Lab Results  Component Value Date   INR 2.4 08/19/2024   INR 2.0 08/10/2024   INR 2.5 08/02/2024    Description   INR 2.4;  Continue taking warfarin 1 tablet daily. Please call Coumadin  clinic with any medication or diet changes.  Anticoagulation Clinic [phone].

## 2024-08-19 NOTE — Progress Notes (Signed)
      [address] [place] [address]             [phone]       HPI:  Patient returns for routine postoperative follow-up having undergone mechanical Wheat procedure (AVR with ascending/hemiarch replacement) on 07/23/24.  He was discharged on on POD 5, he is now almost 4 weeks post-op.  He has been recovering well.  He noticed some new left chest discomfort and intermittent shortness of breath that has now resolved.  Two weeks ago he had a temperature of 100.8 which resolved with tylenol  and has not had any more since then.  He is walking to his mailbox daily and still uses his incentive spirometer.    He currently has an ECHO scheduled for 11/5 but will try to move it sooner.  Allergies as of 08/19/2024   No Known Allergies      Medication List        Accurate as of August 19, 2024  1:34 PM. If you have any questions, ask your nurse or doctor.          acetaminophen  325 MG tablet Commonly known as: TYLENOL  Take 2 tablets (650 mg total) by mouth every 6 (six) hours as needed.   aspirin  EC 81 MG tablet Take 1 tablet (81 mg total) by mouth daily. Swallow whole.   cyclobenzaprine  10 MG tablet Commonly known as: FLEXERIL  Take 10 mg by mouth 3 (three) times daily as needed for muscle spasms.   metoprolol  tartrate 50 MG tablet Commonly known as: LOPRESSOR  Take 1 tablet (50 mg total) by mouth 2 (two) times daily.   omeprazole 20 MG capsule Commonly known as: PRILOSEC Take 20 mg by mouth daily. What changed:  when to take this reasons to take this   PARoxetine  10 MG tablet Commonly known as: PAXIL  Take 10 mg by mouth every morning.   warfarin 2.5 MG tablet Commonly known as: COUMADIN  Take as directed by the anticoagulation clinic. If you are unsure how to take this medication, talk to your nurse or doctor. Original instructions: Take 1 tablet (2.5 mg total) by mouth at bedtime. Or as instructed by the Coumadin  Clinic.        BP  127/77   Pulse 73   Resp 18   Ht 5' 11 (1.803 m)   Wt 277 lb (125.6 kg)   SpO2 95% Comment: RA  BMI 38.63 kg/m   Physical Exam: General - Appears well, good coloring CV - RRR; sternotomy incision CDI Resp - Unlabored on RA, breath sounds clear; chest tube sites healing well Abd - Soft, ND/NT Ext - No edema  Imaging: No new imaging   Assessment/Plan: Jerry Parrish is a 58 year old man  mechanical Wheat procedure (AVR with ascending/hemiarch replacement) on 07/23/24.  He was discharged on on POD 5, he is now about 4 weeks post-op.  He has been recovering well at home, and INR has been therapeutic.  Vitals and weight are stable.  - OK to start driving and increasing lifting capacity - Referral made to cardiac rehab - Transferring his cardiology care to Dr. Anner - Will follow-up in my clinic PRN  Con GORMAN Clunes, MD 1:34 PM 08/19/24

## 2024-08-19 NOTE — Telephone Encounter (Signed)
 Pt insurance is active and benefits verified through [employer] Co-pay 0, DED $1,250/$1,250 met, out of pocket $4,890/$4,890 met, co-insurance 20%. no pre-authorization required, Ben/[employer] 08/19/2024@4 :15, REF# [id number]   TCR/ICR? ICR Visit(date of service)limitation? No limit Can multiple codes be used on the same date of service/visit?(IF ITS A LIMIT) n/a    Is this a lifetime maximum or an annual maximum? annual Has the member used any of these services to date? no Is there a time limit (weeks/months) on start of program and/or program completion? no

## 2024-08-19 NOTE — Telephone Encounter (Signed)
 Received a message stating pt changed his mind and is now interested in the cardiac rehab program. Will forward pt referral to nurse navigator for review.

## 2024-08-20 NOTE — Progress Notes (Signed)
 Last read by Donnice GORMAN Chang at 7:36PM on 08/18/2024.

## 2024-08-20 NOTE — Telephone Encounter (Signed)
 Jerry Parrish

## 2024-08-25 ENCOUNTER — Telehealth (HOSPITAL_COMMUNITY): Payer: Self-pay

## 2024-08-25 NOTE — Telephone Encounter (Signed)
 Attempted to call patient to schedule cardiac rehab- no answer, left message. Sent MyChart message.

## 2024-08-26 ENCOUNTER — Telehealth: Payer: Self-pay | Admitting: Student

## 2024-08-26 ENCOUNTER — Ambulatory Visit: Attending: Cardiology | Admitting: *Deleted

## 2024-08-26 DIAGNOSIS — Z7901 Long term (current) use of anticoagulants: Secondary | ICD-10-CM | POA: Diagnosis not present

## 2024-08-26 DIAGNOSIS — Z952 Presence of prosthetic heart valve: Secondary | ICD-10-CM

## 2024-08-26 LAB — POCT INR: POC INR: 2

## 2024-08-26 NOTE — Telephone Encounter (Signed)
 Called and spoke with the patient to inquire if he knew what antibiotic the dermatologist were planning on utilizing.  Patient states his dermatologist had suggested Keflex.  Informed the patient that we will reach out to our pharmacy department and will call back with their recommendation.    After consultation with Melissa Maccia from the Pharmacy, and getting an ok for Keflex with Warfarin, called and spoke with the patient to inform him. Patient verbalized understanding, and expressed gratitude for the phone call.  All questions and concerns addressed at this time.

## 2024-08-26 NOTE — Patient Instructions (Signed)
 Description   INR 2.0; Continue taking warfarin 1 tablet daily. Please call Coumadin  clinic with any medication or diet changes.  Anticoagulation Clinic [phone].

## 2024-08-26 NOTE — Telephone Encounter (Signed)
 Pt is having a biopsy with the dermatologist and would like to know if antibiotics would interact with his warafin. Please advise.

## 2024-08-26 NOTE — Progress Notes (Signed)
 Lab Results  Component Value Date   INR 2.0 08/26/2024   INR 2.4 08/19/2024   INR 2.0 08/10/2024    Description   INR 2.0;  Continue taking warfarin 1 tablet daily. Please call Coumadin  clinic with any medication or diet changes.  Anticoagulation Clinic [phone].

## 2024-08-27 ENCOUNTER — Telehealth (HOSPITAL_COMMUNITY): Payer: Self-pay

## 2024-08-27 NOTE — Telephone Encounter (Signed)
 Patient called back to get scheduled in the Cardiac Rehab Program. Patient will come in for orientation on 11/11 and will attend the 8:15 exercise class.  Sent MyChart message.

## 2024-08-27 NOTE — Telephone Encounter (Signed)
 Patient left message returning our call to schedule cardiac rehab. Attempted to call patient back- no answer, left message.

## 2024-09-08 ENCOUNTER — Ambulatory Visit (HOSPITAL_COMMUNITY)
Admission: RE | Admit: 2024-09-08 | Discharge: 2024-09-08 | Disposition: A | Discharge status: Home / Self Care | Source: Ambulatory Visit | Attending: Student | Admitting: Student

## 2024-09-08 DIAGNOSIS — Z952 Presence of prosthetic heart valve: Secondary | ICD-10-CM | POA: Diagnosis not present

## 2024-09-08 DIAGNOSIS — I517 Cardiomegaly: Secondary | ICD-10-CM | POA: Insufficient documentation

## 2024-09-08 HISTORY — PX: TRANSTHORACIC ECHOCARDIOGRAM: SHX275

## 2024-09-08 LAB — ECHOCARDIOGRAM COMPLETE
AR max vel: 1.91 cm2
AV Area VTI: 1.83 cm2
AV Area mean vel: 1.83 cm2
AV Mean grad: 6.2 mmHg
AV Peak grad: 11.1 mmHg
Ao pk vel: 1.67 m/s
Area-P 1/2: 3.23 cm2
Calc EF: 64.7 %
S' Lateral: 3.1 cm
Single Plane A2C EF: 69.7 %
Single Plane A4C EF: 62 %

## 2024-09-08 NOTE — Progress Notes (Signed)
  Echocardiogram 2D Echocardiogram has been performed.  Norleen ORN [facility] 09/08/2024, 11:35 AM

## 2024-09-09 ENCOUNTER — Ambulatory Visit

## 2024-09-10 ENCOUNTER — Ambulatory Visit: Attending: Cardiology | Admitting: *Deleted

## 2024-09-10 DIAGNOSIS — Z952 Presence of prosthetic heart valve: Secondary | ICD-10-CM

## 2024-09-10 DIAGNOSIS — Z7901 Long term (current) use of anticoagulants: Secondary | ICD-10-CM

## 2024-09-10 DIAGNOSIS — I359 Nonrheumatic aortic valve disorder, unspecified: Secondary | ICD-10-CM

## 2024-09-10 LAB — POCT INR: INR: 1.6 — AB (ref 2.0–3.0)

## 2024-09-10 NOTE — Progress Notes (Signed)
 Description   INR-1.6; Today take 1.5 tablets of warfarin then continue taking warfarin 1 tablet daily. Please call Coumadin  clinic with any medication or diet changes.  Anticoagulation Clinic [phone].

## 2024-09-10 NOTE — Patient Instructions (Signed)
 Description   INR-1.6; Today take 1.5 tablets of warfarin then continue taking warfarin 1 tablet daily. Please call Coumadin  clinic with any medication or diet changes.  Anticoagulation Clinic [phone].

## 2024-09-13 ENCOUNTER — Telehealth (HOSPITAL_COMMUNITY): Payer: Self-pay

## 2024-09-13 NOTE — Progress Notes (Signed)
 Last read by Donnice GORMAN Chang at 1:22PM on 09/13/2024.

## 2024-09-13 NOTE — Telephone Encounter (Signed)
 Attempted to confirm cardiac rehab orientation date of 09/14/24 @ 1030.

## 2024-09-14 ENCOUNTER — Encounter (HOSPITAL_COMMUNITY)
Admission: RE | Admit: 2024-09-14 | Discharge: 2024-09-14 | Disposition: A | Discharge status: Home / Self Care | Source: Ambulatory Visit | Attending: Cardiology | Admitting: Cardiology

## 2024-09-14 VITALS — BP 118/58 | HR 77 | Ht 71.0 in | Wt 278.2 lb

## 2024-09-14 DIAGNOSIS — Z48812 Encounter for surgical aftercare following surgery on the circulatory system: Secondary | ICD-10-CM | POA: Diagnosis present

## 2024-09-14 DIAGNOSIS — Z952 Presence of prosthetic heart valve: Secondary | ICD-10-CM | POA: Insufficient documentation

## 2024-09-14 NOTE — Progress Notes (Signed)
 Cardiac Individual Treatment Plan  Patient Details  Name: Jerry Parrish MRN: [id number] Date of Birth: 1966-03-17 Referring Provider:   Flowsheet Row INTENSIVE CARDIAC REHAB ORIENT from 09/14/2024 in [facility], V[facility], & Lung Health  Referring Provider Alm Clay, MD    Initial Encounter Date:  Flowsheet Row INTENSIVE CARDIAC REHAB ORIENT from 09/14/2024 in [facility] for Heart, Vascular, & Lung Health  Date 09/15/24    Visit Diagnosis: S/P AVR (aortic valve replacement)  Patient's Home Medications on Admission:  Current Outpatient Medications:    acetaminophen  (TYLENOL ) 325 MG tablet, Take 2 tablets (650 mg total) by mouth every 6 (six) hours as needed., Disp: , Rfl:    aspirin  EC 81 MG tablet, Take 1 tablet (81 mg total) by mouth daily. Swallow whole., Disp: 30 tablet, Rfl: 12   metoprolol  tartrate (LOPRESSOR ) 50 MG tablet, Take 1 tablet (50 mg total) by mouth 2 (two) times daily., Disp: 60 tablet, Rfl: 2   omeprazole (PRILOSEC) 20 MG capsule, Take 20 mg by mouth daily. (Patient taking differently: Take 20 mg by mouth daily as needed (PRN).), Disp: , Rfl:    PARoxetine  (PAXIL ) 10 MG tablet, Take 10 mg by mouth every morning., Disp: , Rfl:    warfarin (COUMADIN ) 2.5 MG tablet, Take 1 tablet (2.5 mg total) by mouth at bedtime. Or as instructed by the [facility]mad[facility]  Clinic., Disp: 60 tablet, Rfl: 6   cyclobenzaprine  (FLEXERIL ) 10 MG tablet, Take 10 mg by mouth 3 (three) times daily as needed for muscle spasms. (Patient not taking: Reported on 08/19/2024), Disp: , Rfl:  No current facility-administered medications for this encounter.  Facility-Administered Medications Ordered in Other Encounters:    [facility], Patient & Family Education, , Does not apply, Once, Su, Con GORMAN, MD  Past Medical History: Past Medical History:  Diagnosis Date   Carpal tunnel syndrome    Chest pain    normal coronaries  07/09/2024   Depression    Heart murmur    07/02/2024 TTE: severe AS, mild AI, mild-moderate MR   Hemorrhoids    HTN (hypertension)    Obesity    T wave inversion in EKG     Tobacco Use: Social History   Tobacco Use  Smoking Status Never  Smokeless Tobacco Not on file    Labs: Review Flowsheet       Latest Ref Rng & Units 07/18/2013 07/09/2024 07/22/2024 07/23/2024  Labs for ITP Cardiac and Pulmonary Rehab  Cholestrol 0 - 200 mg/dL 871  - - -  LDL (calc) 0 - 99 mg/dL 77  - - -  HDL-C >60 mg/dL 42  - - -  Trlycerides <150 mg/dL 45  - - -  Hemoglobin J8r 4.8 - 5.6 % 5.6  - 4.8  -  PH, Arterial 7.35 - 7.45 - 7.365  - 7.333  7.316  7.355  7.419  7.406  7.367  7.235  7.329  7.333   PCO2 arterial 32 - 48 mmHg - 45.1  - 40.3  42.9  38.6  35.6  38.3  41.3  57.3  49.7  49.7   Bicarbonate 20.0 - 28.0 mmol/L - 25.8  25.9  - 21.3  21.8  21.8  23.0  24.1  23.7  24.3  25.3  26.2  26.4   TCO2 22 - 32 mmol/L - 27  27  - 22  23  23  24  24  25  25   23  25  26  25  27  28  24  28  26    Acid-base deficit 0.0 - 2.0 mmol/L - 1.0  - 4.0  4.0  4.0  1.0  1.0  2.0  4.0  1.0   O2 Saturation % - 95  62  - 94  94  95  100  100  100  100  90  100  100     Details       Multiple values from one day are sorted in reverse-chronological order         Capillary Blood Glucose: Lab Results  Component Value Date   GLUCAP 91 07/28/2024   GLUCAP 88 07/28/2024   GLUCAP 126 (H) 07/27/2024   GLUCAP 112 (H) 07/27/2024   GLUCAP 121 (H) 07/27/2024     Exercise Target Goals: Exercise Program Goal: Individual exercise prescription set using results from initial 6 min walk test and THRR while considering  patient's activity barriers and safety.   Exercise Prescription Goal: Initial exercise prescription builds to 30-45 minutes a day of aerobic activity, 2-3 days per week.  Home exercise guidelines will be given to patient during program as part of exercise prescription that the participant will  acknowledge.  Activity Barriers & Risk Stratification:  Activity Barriers & Cardiac Risk Stratification - 09/14/24 1349       Activity Barriers & Cardiac Risk Stratification   Activity Barriers Balance Concerns;Deconditioning;Back Problems;Muscular Weakness    Cardiac Risk Stratification High          6 Minute Walk:  6 Minute Walk     Row Name 09/14/24 1149         6 Minute Walk   Phase Initial     Distance 1320 feet     Walk Time 6 minutes     # of Rest Breaks 0     MPH 2.5     METS 2.8     RPE 11     Perceived Dyspnea  0     VO2 Peak 9.71     Symptoms No     Resting HR 77 bpm     Resting BP 118/58     Resting Oxygen Saturation  95 %     Exercise Oxygen Saturation  during 6 min walk 95 %     Max Ex. HR 84 bpm     Max Ex. BP 124/78     2 Minute Post BP 110/72        Oxygen Initial Assessment:   Oxygen Re-Evaluation:   Oxygen Discharge (Final Oxygen Re-Evaluation):   Initial Exercise Prescription:  Initial Exercise Prescription - 09/14/24 1300       Date of Initial Exercise RX and Referring Provider   Date 09/15/24    Referring Provider Alm Clay, MD    Expected Discharge Date 12/08/24      Recumbant Bike   Level 1    RPM 60    Watts 25    Minutes 15    METs 2.8      T5 Nustep   Level 1    SPM 75    Minutes 15    METs 2.8      Prescription Details   Frequency (times per week) 3    Duration Progress to 30 minutes of continuous aerobic without signs/symptoms of physical distress      Intensity   THRR 40-80% of Max Heartrate 65-131    Ratings of Perceived Exertion 11-13  Perceived Dyspnea 0-4      Progression   Progression Continue progressive overload as per policy without signs/symptoms or physical distress.      Resistance Training   Training Prescription Yes    Weight 3 lbs    Reps 10-15          Perform Capillary Blood Glucose checks as needed.  Exercise Prescription Changes:   Exercise Comments:   Exercise  Goals and Review:   Exercise Goals     Row Name 09/14/24 1059             Exercise Goals   Increase Physical Activity Yes       Intervention Provide advice, education, support and counseling about physical activity/exercise needs.;Develop an individualized exercise prescription for aerobic and resistive training based on initial evaluation findings, risk stratification, comorbidities and participant's personal goals.       Expected Outcomes Short Term: Attend rehab on a regular basis to increase amount of physical activity.;Long Term: Add in home exercise to make exercise part of routine and to increase amount of physical activity.;Long Term: Exercising regularly at least 3-5 days a week.       Increase Strength and Stamina Yes       Intervention Provide advice, education, support and counseling about physical activity/exercise needs.;Develop an individualized exercise prescription for aerobic and resistive training based on initial evaluation findings, risk stratification, comorbidities and participant's personal goals.       Expected Outcomes Short Term: Increase workloads from initial exercise prescription for resistance, speed, and METs.;Short Term: Perform resistance training exercises routinely during rehab and add in resistance training at home;Long Term: Improve cardiorespiratory fitness, muscular endurance and strength as measured by increased METs and functional capacity ( )       Able to understand and use rate of perceived exertion (RPE) scale Yes       Intervention Provide education and explanation on how to use RPE scale       Expected Outcomes Long Term:  Able to use RPE to guide intensity level when exercising independently;Short Term: Able to use RPE daily in rehab to express subjective intensity level       Knowledge and understanding of Target Heart Rate Range (THRR) Yes       Intervention Provide education and explanation of THRR including how the numbers were predicted  and where they are located for reference       Expected Outcomes Short Term: Able to state/look up THRR;Long Term: Able to use THRR to govern intensity when exercising independently;Short Term: Able to use daily as guideline for intensity in rehab       Understanding of Exercise Prescription Yes       Intervention Provide education, explanation, and written materials on patient's individual exercise prescription       Expected Outcomes Short Term: Able to explain program exercise prescription;Long Term: Able to explain home exercise prescription to exercise independently          Exercise Goals Re-Evaluation :   Discharge Exercise Prescription (Final Exercise Prescription Changes):   Nutrition:  Target Goals: Understanding of nutrition guidelines, daily intake of sodium 1500mg , cholesterol 200mg , calories 30% from fat and 7% or less from saturated fats, daily to have 5 or more servings of fruits and vegetables.  Biometrics:   Post Biometrics - 09/14/24 1110        Post  Biometrics   Waist Circumference 53.5 inches    Hip Circumference 52 inches    Waist  to Hip Ratio 1.03 %    Triceps Skinfold 23 mm    % Body Fat 39.1 %    Grip Strength 23 kg    Flexibility 0 in    Single Leg Stand 6.93 seconds          Nutrition Therapy Plan and Nutrition Goals:   Nutrition Assessments:  MEDIFICTS Score Key: >=70 Need to make dietary changes  40-70 Heart Healthy Diet <= 40 Therapeutic Level Cholesterol Diet    Picture Your Plate Scores: <59 Unhealthy dietary pattern with much room for improvement. 41-50 Dietary pattern unlikely to meet recommendations for good health and room for improvement. 51-60 More healthful dietary pattern, with some room for improvement.  >60 Healthy dietary pattern, although there may be some specific behaviors that could be improved.    Nutrition Goals Re-Evaluation:   Nutrition Goals Re-Evaluation:   Nutrition Goals Discharge (Final Nutrition  Goals Re-Evaluation):   Psychosocial: Target Goals: Acknowledge presence or absence of significant depression and/or stress, maximize coping skills, provide positive support system. Participant is able to verbalize types and ability to use techniques and skills needed for reducing stress and depression.  Initial Review & Psychosocial Screening:  Initial Psych Review & Screening - 09/14/24 1356       Initial Review   Current issues with History of Depression;Current Psychotropic Meds;Current Sleep Concerns      Family Dynamics   Good Support System? Yes   Pt has spouse and 2 adult sons     Barriers   Psychosocial barriers to participate in program The patient should benefit from training in stress management and relaxation.      Screening Interventions   Interventions Encouraged to exercise    Expected Outcomes Short Term goal: Utilizing psychosocial counselor, staff and physician to assist with identification of specific Stressors or current issues interfering with healing process. Setting desired goal for each stressor or current issue identified.;Long Term Goal: Stressors or current issues are controlled or eliminated.;Short Term goal: Identification and review with participant of any Quality of Life or Depression concerns found by scoring the questionnaire.;Long Term goal: The participant improves quality of Life and PHQ9 Scores as seen by post scores and/or verbalization of changes          Quality of Life Scores:  Quality of Life - 09/14/24 1100       Quality of Life   Select Quality of Life      Quality of Life Scores   Health/Function Pre 24.67 %    Socioeconomic Pre 27.71 %    Psych/Spiritual Pre 27.86 %    Family Pre 26.4 %    GLOBAL Pre 26.21 %         Scores of 19 and below usually indicate a poorer quality of life in these areas.  A difference of  2-3 points is a clinically meaningful difference.  A difference of 2-3 points in the total score of the Quality of  Life Index has been associated with significant improvement in overall quality of life, self-image, physical symptoms, and general health in studies assessing change in quality of life.  PHQ-9: Review Flowsheet       09/14/2024  Depression screen PHQ 2/9  Decreased Interest 0  Down, Depressed, Hopeless 0  PHQ - 2 Score 0  Altered sleeping 1  Tired, decreased energy 0  Change in appetite 0  Feeling bad or failure about yourself  0  Trouble concentrating 0  Moving slowly or fidgety/restless 0  Suicidal thoughts 0  PHQ-9 Score 1  Difficult doing work/chores Not difficult at all   Interpretation of Total Score  Total Score Depression Severity:  1-4 = Minimal depression, 5-9 = Mild depression, 10-14 = Moderate depression, 15-19 = Moderately severe depression, 20-27 = Severe depression   Psychosocial Evaluation and Intervention:   Psychosocial Re-Evaluation:   Psychosocial Discharge (Final Psychosocial Re-Evaluation):   Vocational Rehabilitation: Provide vocational rehab assistance to qualifying candidates.   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 To return to work 10/18/24         Education: Education Goals: Education classes will be provided on a weekly basis, covering required topics. Participant will state understanding/return demonstration of topics presented.     Core Videos: Exercise    Move It!  Clinical staff conducted group or individual video education with verbal and written material and guidebook.  Patient learns the recommended Pritikin exercise program. Exercise with the goal of living a long, healthy life. Some of the health benefits of exercise include controlled diabetes, healthier blood pressure levels, improved cholesterol levels, improved heart and lung  capacity, improved sleep, and better body composition. Everyone should speak with their doctor before starting or changing an exercise routine.  Biomechanical Limitations Clinical staff conducted group or individual video education with verbal and written material and guidebook.  Patient learns how biomechanical limitations can impact exercise and how we can mitigate and possibly overcome limitations to have an impactful and balanced exercise routine.  Body Composition Clinical staff conducted group or individual video education with verbal and written material and guidebook.  Patient learns that body composition (ratio of muscle mass to fat mass) is a key component to assessing overall fitness, rather than body weight alone. Increased fat mass, especially visceral belly fat, can put us  at increased risk for metabolic syndrome, type 2 diabetes, heart disease, and even death. It is recommended to combine diet and exercise (cardiovascular and resistance training) to improve your body composition. Seek guidance from your physician and exercise physiologist before implementing an exercise routine.  Exercise Action Plan Clinical staff conducted group or individual video education with verbal and written material and guidebook.  Patient learns the recommended strategies to achieve and enjoy long-term exercise adherence, including variety, self-motivation, self-efficacy, and positive decision making. Benefits of exercise include fitness, good health, weight management, more energy, better sleep, less stress, and overall well-being.  Medical   Heart Disease Risk Reduction Clinical staff conducted group or individual video education with verbal and written material and guidebook.  Patient learns our heart is our most vital organ as it circulates oxygen, nutrients, white blood cells, and hormones throughout the entire body, and carries waste away. Data supports a plant-based eating plan like the Pritikin  Program for its effectiveness in slowing progression of and reversing heart disease. The video provides a number of recommendations to address heart disease.   Metabolic Syndrome and Belly Fat  Clinical staff conducted group or individual video education with verbal and written material and guidebook.  Patient learns what metabolic syndrome is, how it leads to heart disease, and how one can reverse it and keep it from coming back. You have metabolic syndrome if you have 3 of the following 5 criteria: abdominal obesity, high blood pressure, high triglycerides, low HDL cholesterol,  and high blood sugar.  Hypertension and Heart Disease Clinical staff conducted group or individual video education with verbal and written material and guidebook.  Patient learns that high blood pressure, or hypertension, is very common in the [place] . Hypertension is largely due to excessive salt intake, but other important risk factors include being overweight, physical inactivity, drinking too much alcohol, smoking, and not eating enough potassium from fruits and vegetables. High blood pressure is a leading risk factor for heart attack, stroke, congestive heart failure, dementia, kidney failure, and premature death. Long-term effects of excessive salt intake include stiffening of the arteries and thickening of heart muscle and organ damage. Recommendations include ways to reduce hypertension and the risk of heart disease.  Diseases of Our Time - Focusing on Diabetes Clinical staff conducted group or individual video education with verbal and written material and guidebook.  Patient learns why the best way to stop diseases of our time is prevention, through food and other lifestyle changes. Medicine (such as prescription pills and surgeries) is often only a Band-Aid on the problem, not a long-term solution. Most common diseases of our time include obesity, type 2 diabetes, hypertension, heart disease, and cancer. The  Pritikin Program is recommended and has been proven to help reduce, reverse, and/or prevent the damaging effects of metabolic syndrome.  Nutrition   Overview of the Pritikin Eating Plan  Clinical staff conducted group or individual video education with verbal and written material and guidebook.  Patient learns about the Pritikin Eating Plan for disease risk reduction. The Pritikin Eating Plan emphasizes a wide variety of unrefined, minimally-processed carbohydrates, like fruits, vegetables, whole grains, and legumes. Go, Caution, and Stop food choices are explained. Plant-based and lean animal proteins are emphasized. Rationale provided for low sodium intake for blood pressure control, low added sugars for blood sugar stabilization, and low added fats and oils for coronary artery disease risk reduction and weight management.  Calorie Density  Clinical staff conducted group or individual video education with verbal and written material and guidebook.  Patient learns about calorie density and how it impacts the Pritikin Eating Plan. Knowing the characteristics of the food you choose will help you decide whether those foods will lead to weight gain or weight loss, and whether you want to consume more or less of them. Weight loss is usually a side effect of the Pritikin Eating Plan because of its focus on low calorie-dense foods.  Label Reading  Clinical staff conducted group or individual video education with verbal and written material and guidebook.  Patient learns about the Pritikin recommended label reading guidelines and corresponding recommendations regarding calorie density, added sugars, sodium content, and whole grains.  Dining Out - Part 1  Clinical staff conducted group or individual video education with verbal and written material and guidebook.  Patient learns that restaurant meals can be sabotaging because they can be so high in calories, fat, sodium, and/or sugar. Patient learns  recommended strategies on how to positively address this and avoid unhealthy pitfalls.  Facts on Fats  Clinical staff conducted group or individual video education with verbal and written material and guidebook.  Patient learns that lifestyle modifications can be just as effective, if not more so, as many medications for lowering your risk of heart disease. A Pritikin lifestyle can help to reduce your risk of inflammation and atherosclerosis (cholesterol build-up, or plaque, in the artery walls). Lifestyle interventions such as dietary choices and physical activity address the cause of atherosclerosis. A review of  the types of fats and their impact on blood cholesterol levels, along with dietary recommendations to reduce fat intake is also included.  Nutrition Action Plan  Clinical staff conducted group or individual video education with verbal and written material and guidebook.  Patient learns how to incorporate Pritikin recommendations into their lifestyle. Recommendations include planning and keeping personal health goals in mind as an important part of their success.  Healthy Mind-Set    Healthy Minds, Bodies, Hearts  Clinical staff conducted group or individual video education with verbal and written material and guidebook.  Patient learns how to identify when they are stressed. Video will discuss the impact of that stress, as well as the many benefits of stress management. Patient will also be introduced to stress management techniques. The way we think, act, and feel has an impact on our hearts.  How Our Thoughts Can Heal Our Hearts  Clinical staff conducted group or individual video education with verbal and written material and guidebook.  Patient learns that negative thoughts can cause depression and anxiety. This can result in negative lifestyle behavior and serious health problems. Cognitive behavioral therapy is an effective method to help control our thoughts in order to change and  improve our emotional outlook.  Additional Videos:  Exercise    Improving Performance  Clinical staff conducted group or individual video education with verbal and written material and guidebook.  Patient learns to use a non-linear approach by alternating intensity levels and lengths of time spent exercising to help burn more calories and lose more body fat. Cardiovascular exercise helps improve heart health, metabolism, hormonal balance, blood sugar control, and recovery from fatigue. Resistance training improves strength, endurance, balance, coordination, reaction time, metabolism, and muscle mass. Flexibility exercise improves circulation, posture, and balance. Seek guidance from your physician and exercise physiologist before implementing an exercise routine and learn your capabilities and proper form for all exercise.  Introduction to Yoga  Clinical staff conducted group or individual video education with verbal and written material and guidebook.  Patient learns about yoga, a discipline of the coming together of mind, breath, and body. The benefits of yoga include improved flexibility, improved range of motion, better posture and core strength, increased lung function, weight loss, and positive self-image. Yoga's heart health benefits include lowered blood pressure, healthier heart rate, decreased cholesterol and triglyceride levels, improved immune function, and reduced stress. Seek guidance from your physician and exercise physiologist before implementing an exercise routine and learn your capabilities and proper form for all exercise.  Medical   Aging: Enhancing Your Quality of Life  Clinical staff conducted group or individual video education with verbal and written material and guidebook.  Patient learns key strategies and recommendations to stay in good physical health and enhance quality of life, such as prevention strategies, having an advocate, securing a Health Care Proxy and Power of  Attorney, and keeping a list of medications and system for tracking them. It also discusses how to avoid risk for bone loss.  Biology of Weight Control  Clinical staff conducted group or individual video education with verbal and written material and guidebook.  Patient learns that weight gain occurs because we consume more calories than we burn (eating more, moving less). Even if your body weight is normal, you may have higher ratios of fat compared to muscle mass. Too much body fat puts you at increased risk for cardiovascular disease, heart attack, stroke, type 2 diabetes, and obesity-related cancers. In addition to exercise, following the Pritikin Eating  Plan can help reduce your risk.  Decoding Lab Results  Clinical staff conducted group or individual video education with verbal and written material and guidebook.  Patient learns that lab test reflects one measurement whose values change over time and are influenced by many factors, including medication, stress, sleep, exercise, food, hydration, pre-existing medical conditions, and more. It is recommended to use the knowledge from this video to become more involved with your lab results and evaluate your numbers to speak with your doctor.   Diseases of Our Time - Overview  Clinical staff conducted group or individual video education with verbal and written material and guidebook.  Patient learns that according to the CDC, 50% to 70% of chronic diseases (such as obesity, type 2 diabetes, elevated lipids, hypertension, and heart disease) are avoidable through lifestyle improvements including healthier food choices, listening to satiety cues, and increased physical activity.  Sleep Disorders Clinical staff conducted group or individual video education with verbal and written material and guidebook.  Patient learns how good quality and duration of sleep are important to overall health and well-being. Patient also learns about sleep disorders and  how they impact health along with recommendations to address them, including discussing with a physician.  Nutrition  Dining Out - Part 2 Clinical staff conducted group or individual video education with verbal and written material and guidebook.  Patient learns how to plan ahead and communicate in order to maximize their dining experience in a healthy and nutritious manner. Included are recommended food choices based on the type of restaurant the patient is visiting.   Fueling a Banker conducted group or individual video education with verbal and written material and guidebook.  There is a strong connection between our food choices and our health. Diseases like obesity and type 2 diabetes are very prevalent and are in large-part due to lifestyle choices. The Pritikin Eating Plan provides plenty of food and hunger-curbing satisfaction. It is easy to follow, affordable, and helps reduce health risks.  Menu Workshop  Clinical staff conducted group or individual video education with verbal and written material and guidebook.  Patient learns that restaurant meals can sabotage health goals because they are often packed with calories, fat, sodium, and sugar. Recommendations include strategies to plan ahead and to communicate with the manager, chef, or server to help order a healthier meal.  Planning Your Eating Strategy  Clinical staff conducted group or individual video education with verbal and written material and guidebook.  Patient learns about the Pritikin Eating Plan and its benefit of reducing the risk of disease. The Pritikin Eating Plan does not focus on calories. Instead, it emphasizes high-quality, nutrient-rich foods. By knowing the characteristics of the foods, we choose, we can determine their calorie density and make informed decisions.  Targeting Your Nutrition Priorities  Clinical staff conducted group or individual video education with verbal and written  material and guidebook.  Patient learns that lifestyle habits have a tremendous impact on disease risk and progression. This video provides eating and physical activity recommendations based on your personal health goals, such as reducing LDL cholesterol, losing weight, preventing or controlling type 2 diabetes, and reducing high blood pressure.  Vitamins and Minerals  Clinical staff conducted group or individual video education with verbal and written material and guidebook.  Patient learns different ways to obtain key vitamins and minerals, including through a recommended healthy diet. It is important to discuss all supplements you take with your doctor.   Healthy Mind-Set  Smoking Cessation  Clinical staff conducted group or individual video education with verbal and written material and guidebook.  Patient learns that cigarette smoking and tobacco addiction pose a serious health risk which affects millions of people. Stopping smoking will significantly reduce the risk of heart disease, lung disease, and many forms of cancer. Recommended strategies for quitting are covered, including working with your doctor to develop a successful plan.  Culinary   Becoming a Set Designer conducted group or individual video education with verbal and written material and guidebook.  Patient learns that cooking at home can be healthy, cost-effective, quick, and puts them in control. Keys to cooking healthy recipes will include looking at your recipe, assessing your equipment needs, planning ahead, making it simple, choosing cost-effective seasonal ingredients, and limiting the use of added fats, salts, and sugars.  Cooking - Breakfast and Snacks  Clinical staff conducted group or individual video education with verbal and written material and guidebook.  Patient learns how important breakfast is to satiety and nutrition through the entire day. Recommendations include key foods to eat during  breakfast to help stabilize blood sugar levels and to prevent overeating at meals later in the day. Planning ahead is also a key component.  Cooking - Educational Psychologist conducted group or individual video education with verbal and written material and guidebook.  Patient learns eating strategies to improve overall health, including an approach to cook more at home. Recommendations include thinking of animal protein as a side on your plate rather than center stage and focusing instead on lower calorie dense options like vegetables, fruits, whole grains, and plant-based proteins, such as beans. Making sauces in large quantities to freeze for later and leaving the skin on your vegetables are also recommended to maximize your experience.  Cooking - Healthy Salads and Dressing Clinical staff conducted group or individual video education with verbal and written material and guidebook.  Patient learns that vegetables, fruits, whole grains, and legumes are the foundations of the Pritikin Eating Plan. Recommendations include how to incorporate each of these in flavorful and healthy salads, and how to create homemade salad dressings. Proper handling of ingredients is also covered. Cooking - Soups and [employer] - Soups and Desserts Clinical staff conducted group or individual video education with verbal and written material and guidebook.  Patient learns that Pritikin soups and desserts make for easy, nutritious, and delicious snacks and meal components that are low in sodium, fat, sugar, and calorie density, while high in vitamins, minerals, and filling fiber. Recommendations include simple and healthy ideas for soups and desserts.   Overview     The Pritikin Solution Program Overview Clinical staff conducted group or individual video education with verbal and written material and guidebook.  Patient learns that the results of the Pritikin Program have been documented in more than 100  articles published in peer-reviewed journals, and the benefits include reducing risk factors for (and, in some cases, even reversing) high cholesterol, high blood pressure, type 2 diabetes, obesity, and more! An overview of the three key pillars of the Pritikin Program will be covered: eating well, doing regular exercise, and having a healthy mind-set.  WORKSHOPS  Exercise: Exercise Basics: Building Your Action Plan Clinical staff led group instruction and group discussion with PowerPoint presentation and patient guidebook. To enhance the learning environment the use of posters, models and videos may be added. At the conclusion of this workshop, patients will comprehend the difference between  physical activity and exercise, as well as the benefits of incorporating both, into their routine. Patients will understand the FITT (Frequency, Intensity, Time, and Type) principle and how to use it to build an exercise action plan. In addition, safety concerns and other considerations for exercise and cardiac rehab will be addressed by the presenter. The purpose of this lesson is to promote a comprehensive and effective weekly exercise routine in order to improve patients' overall level of fitness.   Managing Heart Disease: Your Path to a Healthier Heart Clinical staff led group instruction and group discussion with PowerPoint presentation and patient guidebook. To enhance the learning environment the use of posters, models and videos may be added.At the conclusion of this workshop, patients will understand the anatomy and physiology of the heart. Additionally, they will understand how Pritikin's three pillars impact the risk factors, the progression, and the management of heart disease.  The purpose of this lesson is to provide a high-level overview of the heart, heart disease, and how the Pritikin lifestyle positively impacts risk factors.  Exercise Biomechanics Clinical staff led group instruction and  group discussion with PowerPoint presentation and patient guidebook. To enhance the learning environment the use of posters, models and videos may be added. Patients will learn how the structural parts of their bodies function and how these functions impact their daily activities, movement, and exercise. Patients will learn how to promote a neutral spine, learn how to manage pain, and identify ways to improve their physical movement in order to promote healthy living. The purpose of this lesson is to expose patients to common physical limitations that impact physical activity. Participants will learn practical ways to adapt and manage aches and pains, and to minimize their effect on regular exercise. Patients will learn how to maintain good posture while sitting, walking, and lifting.  Balance Training and Fall Prevention  Clinical staff led group instruction and group discussion with PowerPoint presentation and patient guidebook. To enhance the learning environment the use of posters, models and videos may be added. At the conclusion of this workshop, patients will understand the importance of their sensorimotor skills (vision, proprioception, and the vestibular system) in maintaining their ability to balance as they age. Patients will apply a variety of balancing exercises that are appropriate for their current level of function. Patients will understand the common causes for poor balance, possible solutions to these problems, and ways to modify their physical environment in order to minimize their fall risk. The purpose of this lesson is to teach patients about the importance of maintaining balance as they age and ways to minimize their risk of falling.  WORKSHOPS   Nutrition:  Fueling a Ship Broker led group instruction and group discussion with PowerPoint presentation and patient guidebook. To enhance the learning environment the use of posters, models and videos may be  added. Patients will review the foundational principles of the Pritikin Eating Plan and understand what constitutes a serving size in each of the food groups. Patients will also learn Pritikin-friendly foods that are better choices when away from home and review make-ahead meal and snack options. Calorie density will be reviewed and applied to three nutrition priorities: weight maintenance, weight loss, and weight gain. The purpose of this lesson is to reinforce (in a group setting) the key concepts around what patients are recommended to eat and how to apply these guidelines when away from home by planning and selecting Pritikin-friendly options. Patients will understand how calorie density may be adjusted  for different weight management goals.  Mindful Eating  Clinical staff led group instruction and group discussion with PowerPoint presentation and patient guidebook. To enhance the learning environment the use of posters, models and videos may be added. Patients will briefly review the concepts of the Pritikin Eating Plan and the importance of low-calorie dense foods. The concept of mindful eating will be introduced as well as the importance of paying attention to internal hunger signals. Triggers for non-hunger eating and techniques for dealing with triggers will be explored. The purpose of this lesson is to provide patients with the opportunity to review the basic principles of the Pritikin Eating Plan, discuss the value of eating mindfully and how to measure internal cues of hunger and fullness using the Hunger Scale. Patients will also discuss reasons for non-hunger eating and learn strategies to use for controlling emotional eating.  Targeting Your Nutrition Priorities Clinical staff led group instruction and group discussion with PowerPoint presentation and patient guidebook. To enhance the learning environment the use of posters, models and videos may be added. Patients will learn how to determine  their genetic susceptibility to disease by reviewing their family history. Patients will gain insight into the importance of diet as part of an overall healthy lifestyle in mitigating the impact of genetics and other environmental insults. The purpose of this lesson is to provide patients with the opportunity to assess their personal nutrition priorities by looking at their family history, their own health history and current risk factors. Patients will also be able to discuss ways of prioritizing and modifying the Pritikin Eating Plan for their highest risk areas  Menu  Clinical staff led group instruction and group discussion with PowerPoint presentation and patient guidebook. To enhance the learning environment the use of posters, models and videos may be added. Using menus brought in from [employer], or printed from [employer], patients will apply the Pritikin dining out guidelines that were presented in the [employer] video. Patients will also be able to practice these guidelines in a variety of provided scenarios. The purpose of this lesson is to provide patients with the opportunity to practice hands-on learning of the Pritikin Dining Out guidelines with actual menus and practice scenarios.  Label Reading Clinical staff led group instruction and group discussion with PowerPoint presentation and patient guidebook. To enhance the learning environment the use of posters, models and videos may be added. Patients will review and discuss the Pritikin label reading guidelines presented in Pritikin's Label Reading Educational series video. Using fool labels brought in from local grocery stores and markets, patients will apply the label reading guidelines and determine if the packaged food meet the Pritikin guidelines. The purpose of this lesson is to provide patients with the opportunity to review, discuss, and practice hands-on learning of the Pritikin Label Reading guidelines  with actual packaged food labels. Cooking School  Pritikin's [employer] are designed to teach patients ways to prepare quick, simple, and affordable recipes at home. The importance of nutrition's role in chronic disease risk reduction is reflected in its emphasis in the overall Pritikin program. By learning how to prepare essential core Pritikin Eating Plan recipes, patients will increase control over what they eat; be able to customize the flavor of foods without the use of added salt, sugar, or fat; and improve the quality of the food they consume. By learning a set of core recipes which are easily assembled, quickly prepared, and affordable, patients are more likely to prepare  more healthy foods at home. These workshops focus on convenient breakfasts, simple entres, side dishes, and desserts which can be prepared with minimal effort and are consistent with nutrition recommendations for cardiovascular risk reduction. Cooking [employer] are taught by a armed forces logistics/support/administrative officer (RD) who has been trained by the [employer]. The chef or RD has a clear understanding of the importance of minimizing - if not completely eliminating - added fat, sugar, and sodium in recipes. Throughout the series of Cooking School Workshop sessions, patients will learn about healthy ingredients and efficient methods of cooking to build confidence in their capability to prepare    Cooking School weekly topics:  Adding Flavor- Sodium-Free  Fast and Healthy Breakfasts  Powerhouse Plant-Based Proteins  Satisfying Salads and Dressings  Simple Sides and Sauces  International Cuisine-Spotlight on the [employer] Zones  Delicious Desserts  Savory Soups  [employer] - Meals in a Astronomer Appetizers and Snacks  Comforting Weekend Breakfasts  One-Pot Wonders   Fast Evening Meals  Landscape Architect Your Pritikin Plate  WORKSHOPS   Healthy Mindset  (Psychosocial):  Focused Goals, Sustainable Changes Clinical staff led group instruction and group discussion with PowerPoint presentation and patient guidebook. To enhance the learning environment the use of posters, models and videos may be added. Patients will be able to apply effective goal setting strategies to establish at least one personal goal, and then take consistent, meaningful action toward that goal. They will learn to identify common barriers to achieving personal goals and develop strategies to overcome them. Patients will also gain an understanding of how our mind-set can impact our ability to achieve goals and the importance of cultivating a positive and growth-oriented mind-set. The purpose of this lesson is to provide patients with a deeper understanding of how to set and achieve personal goals, as well as the tools and strategies needed to overcome common obstacles which may arise along the way.  From Head to Heart: The Power of a Healthy Outlook  Clinical staff led group instruction and group discussion with PowerPoint presentation and patient guidebook. To enhance the learning environment the use of posters, models and videos may be added. Patients will be able to recognize and describe the impact of emotions and mood on physical health. They will discover the importance of self-care and explore self-care practices which may work for them. Patients will also learn how to utilize the 4 C's to cultivate a healthier outlook and better manage stress and challenges. The purpose of this lesson is to demonstrate to patients how a healthy outlook is an essential part of maintaining good health, especially as they continue their cardiac rehab journey.  Healthy Sleep for a Healthy Heart Clinical staff led group instruction and group discussion with PowerPoint presentation and patient guidebook. To enhance the learning environment the use of posters, models and videos may be added. At the  conclusion of this workshop, patients will be able to demonstrate knowledge of the importance of sleep to overall health, well-being, and quality of life. They will understand the symptoms of, and treatments for, common sleep disorders. Patients will also be able to identify daytime and nighttime behaviors which impact sleep, and they will be able to apply these tools to help manage sleep-related challenges. The purpose of this lesson is to provide patients with a general overview of sleep and outline the importance of quality sleep. Patients will learn about a few of the most common sleep disorders. Patients will  also be introduced to the concept of "sleep hygiene," and discover ways to self-manage certain sleeping problems through simple daily behavior changes. Finally, the workshop will motivate patients by clarifying the links between quality sleep and their goals of heart-healthy living.   Recognizing and Reducing Stress Clinical staff led group instruction and group discussion with PowerPoint presentation and patient guidebook. To enhance the learning environment the use of posters, models and videos may be added. At the conclusion of this workshop, patients will be able to understand the types of stress reactions, differentiate between acute and chronic stress, and recognize the impact that chronic stress has on their health. They will also be able to apply different coping mechanisms, such as reframing negative self-talk. Patients will have the opportunity to practice a variety of stress management techniques, such as deep abdominal breathing, progressive muscle relaxation, and/or guided imagery.  The purpose of this lesson is to educate patients on the role of stress in their lives and to provide healthy techniques for coping with it.  Learning Barriers/Preferences:  Learning Barriers/Preferences - 09/14/24 1357       Learning Barriers/Preferences   Learning Barriers Sight    Learning Preferences  Computer/Internet;Audio;Group Instruction;Individual Instruction;Pictoral;Skilled Demonstration;Verbal Instruction;Video;Written Material          Education Topics:  Knowledge Questionnaire Score:  Knowledge Questionnaire Score - 09/14/24 1101       Knowledge Questionnaire Score   Pre Score 23/24          Core Components/Risk Factors/Patient Goals at Admission:  Personal Goals and Risk Factors at Admission - 09/14/24 1358       Core Components/Risk Factors/Patient Goals on Admission    Weight Management Yes;Obesity;Weight Loss    Intervention Weight Management: Develop a combined nutrition and exercise program designed to reach desired caloric intake, while maintaining appropriate intake of nutrient and fiber, sodium and fats, and appropriate energy expenditure required for the weight goal.;Weight Management: Provide education and appropriate resources to help participant work on and attain dietary goals.;Weight Management/Obesity: Establish reasonable short term and long term weight goals.;Obesity: Provide education and appropriate resources to help participant work on and attain dietary goals.    Admit Weight 278 lb 3.5 oz (126.2 kg)    Goal Weight: Long Term 266 lb (120.7 kg)    Expected Outcomes Short Term: Continue to assess and modify interventions until short term weight is achieved;Long Term: Adherence to nutrition and physical activity/exercise program aimed toward attainment of established weight goal;Weight Loss: Understanding of general recommendations for a balanced deficit meal plan, which promotes 1-2 lb weight loss per week and includes a negative energy balance of [phone] kcal/d;Understanding recommendations for meals to include 15-35% energy as protein, 25-35% energy from fat, 35-60% energy from carbohydrates, less than 200mg  of dietary cholesterol, 20-35 gm of total fiber daily;Understanding of distribution of calorie intake throughout the day with the consumption of  4-5 meals/snacks    Hypertension Yes    Intervention Monitor prescription use compliance.;Provide education on lifestyle modifcations including regular physical activity/exercise, weight management, moderate sodium restriction and increased consumption of fresh fruit, vegetables, and low fat dairy, alcohol moderation, and smoking cessation.    Expected Outcomes Short Term: Continued assessment and intervention until BP is < 140/29mm HG in hypertensive participants. < 130/67mm HG in hypertensive participants with diabetes, heart failure or chronic kidney disease.;Long Term: Maintenance of blood pressure at goal levels.          Core Components/Risk Factors/Patient Goals Review:    Core Components/Risk  Factors/Patient Goals at Discharge (Final Review):    ITP Comments:  ITP Comments     Row Name 09/14/24 1057           ITP Comments Dr. Wilbert Bihari medical director. Introduction to pritikin education/intensive cardiac rehab. Initial orientation packet reviewed with patient.          Comments: Participant attended orientation for the cardiac rehabilitation program on  09/14/2024  to perform initial intake and exercise walk test. Patient introduced to the Pritikin Program education and orientation packet was reviewed. Completed 6-minute walk test, measurements, initial ITP, and exercise prescription. Vital signs stable. Telemetry-normal sinus rhythm, asymptomatic.   Service time was from 10:28 to 12:04.

## 2024-09-14 NOTE — Progress Notes (Signed)
 Cardiac Rehab Medication Review by a Nurse   Does the patient  feel that his/her medications are working for him/her?  yes   Has the patient been experiencing any side effects to the medications prescribed?  no   Does the patient measure his/her own blood pressure or blood glucose at home?  no    Does the patient have any problems obtaining medications due to transportation or finances?   no   Understanding of regimen: excellent Understanding of indications: excellent Potential of compliance: good   Nurse comments: Pt states, I have no questions about my prescribed medications at this time

## 2024-09-20 ENCOUNTER — Encounter (HOSPITAL_COMMUNITY)
Admission: RE | Admit: 2024-09-20 | Discharge: 2024-09-20 | Disposition: A | Discharge status: Home / Self Care | Source: Ambulatory Visit | Attending: Cardiology | Admitting: Cardiology

## 2024-09-20 DIAGNOSIS — Z952 Presence of prosthetic heart valve: Secondary | ICD-10-CM

## 2024-09-20 DIAGNOSIS — Z48812 Encounter for surgical aftercare following surgery on the circulatory system: Secondary | ICD-10-CM | POA: Diagnosis not present

## 2024-09-20 NOTE — Progress Notes (Signed)
 Daily Session Note  Patient Details  Name: Jerry Parrish MRN: [id number] Date of Birth: [birth year] Referring Provider:   Flowsheet Row INTENSIVE CARDIAC REHAB ORIENT from 09/14/2024 in [facility]  Referring Provider Alm Clay, MD    Encounter Date: 09/20/2024  Check In:  Session Check In - 09/20/24 0816       Check-In   Supervising physician immediately available to respond to emergencies [facility] MD immediately available    Physician(s) Josefa Beauvais, NP    Location [facility]    Staff Present Arnoldo Gal, MS, ACSM-CEP, Exercise Physiologist;David Janann, MS, ACSM-CEP, CCRP, Exercise Physiologist;Joseph Lennon, RN, Jayson Quan, RN, Maud Moats, MS, ACSM-CEP, Exercise Physiologist    Virtual Visit No    Medication changes reported     No    Fall or balance concerns reported    No    Tobacco Cessation No Change    Warm-up and Cool-down Performed as group-led instruction    Resistance Training Performed Yes    VAD Patient? No    PAD/SET Patient? No      Pain Assessment   Currently in Pain? No/denies    Pain Score 0-No pain    Multiple Pain Sites No          Capillary Blood Glucose: No results found for this or any previous visit (from the past 24 hours).   Exercise Prescription Changes - 09/20/24 1600       Response to Exercise   Blood Pressure (Admit) 124/68    Blood Pressure (Exercise) 145/78    Blood Pressure (Exit) 104/62    Heart Rate (Admit) 62 bpm    Heart Rate (Exercise) 81 bpm    Heart Rate (Exit) 68 bpm    Rating of Perceived Exertion (Exercise) 11    Symptoms None    Comments Pt's first day in the [facility]2 program    Duration Continue with 30 min of aerobic exercise without signs/symptoms of physical distress.    Intensity THRR unchanged      Progression   Progression Continue to progress workloads to maintain intensity without signs/symptoms of physical  distress.    Average METs 1.9      Resistance Training   Training Prescription Yes    Weight 3 lbs    Reps 10-15    Time 5 Minutes      Interval Training   Interval Training No      Recumbant Bike   Level 1    RPM 54    Watts 10    Minutes 15    METs 1.9      NuStep   Level 1    SPM 69    Minutes 15    METs 1.9          Social History   Tobacco Use  Smoking Status Never  Smokeless Tobacco Not on file    Goals Met:  Exercise tolerated well No report of concerns or symptoms today Strength training completed today  Goals Unmet:  Not Applicable  Comments: Pt started cardiac rehab today.  Pt tolerated light exercise without difficulty. VSS, telemetry-Sinus rhythm, asymptomatic.  Medication list reconciled. Pt denies barriers to medicaiton compliance.  PSYCHOSOCIAL ASSESSMENT:  PHQ-1. Pt exhibits positive coping skills, hopeful outlook with supportive family. No psychosocial needs identified at this time, no psychosocial interventions necessary.    Pt enjoys watching sci fi.   Pt oriented to exercise equipment and routine.  Understanding verbalized.Hadassah Elpidio Quan RN BSN    Dr. Wilbert Bihari is Medical Director for Cardiac Rehab at [facility].

## 2024-09-22 ENCOUNTER — Encounter (HOSPITAL_COMMUNITY)
Admission: RE | Admit: 2024-09-22 | Discharge: 2024-09-22 | Disposition: A | Source: Ambulatory Visit | Attending: Cardiology

## 2024-09-22 DIAGNOSIS — Z48812 Encounter for surgical aftercare following surgery on the circulatory system: Secondary | ICD-10-CM | POA: Diagnosis not present

## 2024-09-22 DIAGNOSIS — Z952 Presence of prosthetic heart valve: Secondary | ICD-10-CM

## 2024-09-23 ENCOUNTER — Ambulatory Visit: Attending: Cardiology

## 2024-09-23 DIAGNOSIS — I359 Nonrheumatic aortic valve disorder, unspecified: Secondary | ICD-10-CM

## 2024-09-23 DIAGNOSIS — Z7901 Long term (current) use of anticoagulants: Secondary | ICD-10-CM

## 2024-09-23 DIAGNOSIS — Z952 Presence of prosthetic heart valve: Secondary | ICD-10-CM

## 2024-09-23 LAB — POCT INR: INR: 1.4 — AB (ref 2.0–3.0)

## 2024-09-23 NOTE — Patient Instructions (Signed)
 Today take 2 tablets of warfarin then continue taking warfarin 1 tablet daily. Please call Coumadin  clinic with any medication or diet changes.  Anticoagulation Clinic [phone].  INR in 2 weeks

## 2024-09-23 NOTE — Progress Notes (Signed)
 INR 1.4  Please see anticoagulation encounter  Today take 2 tablets of warfarin then continue taking warfarin 1 tablet daily. Please call Coumadin  clinic with any medication or diet changes.  Anticoagulation Clinic [phone].  INR in 2 weeks

## 2024-09-24 ENCOUNTER — Encounter (HOSPITAL_COMMUNITY)
Admission: RE | Admit: 2024-09-24 | Discharge: 2024-09-24 | Disposition: A | Discharge status: Home / Self Care | Source: Ambulatory Visit | Attending: Cardiology | Admitting: Cardiology

## 2024-09-24 DIAGNOSIS — Z48812 Encounter for surgical aftercare following surgery on the circulatory system: Secondary | ICD-10-CM | POA: Diagnosis not present

## 2024-09-24 DIAGNOSIS — Z952 Presence of prosthetic heart valve: Secondary | ICD-10-CM

## 2024-09-27 ENCOUNTER — Encounter (HOSPITAL_COMMUNITY)
Admission: RE | Admit: 2024-09-27 | Discharge: 2024-09-27 | Disposition: A | Source: Ambulatory Visit | Attending: Cardiology

## 2024-09-27 DIAGNOSIS — Z48812 Encounter for surgical aftercare following surgery on the circulatory system: Secondary | ICD-10-CM | POA: Diagnosis not present

## 2024-09-27 DIAGNOSIS — Z952 Presence of prosthetic heart valve: Secondary | ICD-10-CM

## 2024-09-29 ENCOUNTER — Encounter (HOSPITAL_COMMUNITY)
Admission: RE | Admit: 2024-09-29 | Discharge: 2024-09-29 | Disposition: A | Discharge status: Home / Self Care | Source: Ambulatory Visit | Attending: Cardiology | Admitting: Cardiology

## 2024-09-29 DIAGNOSIS — Z48812 Encounter for surgical aftercare following surgery on the circulatory system: Secondary | ICD-10-CM | POA: Diagnosis not present

## 2024-09-29 DIAGNOSIS — Z952 Presence of prosthetic heart valve: Secondary | ICD-10-CM

## 2024-10-01 ENCOUNTER — Encounter (HOSPITAL_COMMUNITY)

## 2024-10-04 ENCOUNTER — Encounter (HOSPITAL_COMMUNITY)
Admission: RE | Admit: 2024-10-04 | Discharge: 2024-10-04 | Disposition: A | Source: Ambulatory Visit | Attending: Cardiology

## 2024-10-04 DIAGNOSIS — Z952 Presence of prosthetic heart valve: Secondary | ICD-10-CM | POA: Insufficient documentation

## 2024-10-05 NOTE — Progress Notes (Signed)
 Cardiac Individual Treatment Plan  Patient Details  Name: Jerry Parrish MRN: [id number] Date of Birth: January 16, 1966 Referring Provider:   Flowsheet Row INTENSIVE CARDIAC REHAB ORIENT from 09/14/2024 in [facility]  Referring Provider Alm Clay, MD    Initial Encounter Date:  Flowsheet Row INTENSIVE CARDIAC REHAB ORIENT from 09/14/2024 in [facility] for [facility], [facility], [facility]  Date 09/15/24    Visit Diagnosis: S/P AVR (aortic valve replacement)  Patient's Home Medications on Admission:  Current Outpatient Medications:    acetaminophen  (TYLENOL ) 325 MG tablet, Take 2 tablets (650 mg total) by mouth every 6 (six) hours as needed., Disp: , Rfl:    aspirin  EC 81 MG tablet, Take 1 tablet (81 mg total) by mouth daily. Swallow whole., Disp: 30 tablet, Rfl: 12   cyclobenzaprine  (FLEXERIL ) 10 MG tablet, Take 10 mg by mouth 3 (three) times daily as needed for muscle spasms. (Patient not taking: Reported on 08/19/2024), Disp: , Rfl:    metoprolol  tartrate (LOPRESSOR ) 50 MG tablet, Take 1 tablet (50 mg total) by mouth 2 (two) times daily., Disp: 60 tablet, Rfl: 2   omeprazole (PRILOSEC) 20 MG capsule, Take 20 mg by mouth daily. (Patient taking differently: Take 20 mg by mouth daily as needed (PRN).), Disp: , Rfl:    PARoxetine  (PAXIL ) 10 MG tablet, Take 10 mg by mouth every morning., Disp: , Rfl:    warfarin (COUMADIN ) 2.5 MG tablet, Take 1 tablet (2.5 mg total) by mouth at bedtime. Or as instructed by the Coumadin  Clinic., Disp: 60 tablet, Rfl: 6 No current facility-administered medications for this encounter.  Facility-Administered Medications Ordered in Other Encounters:    [facility], Patient & Family Education, , Does not apply, Once, Su, Con GORMAN, MD  Past Medical History: Past Medical History:  Diagnosis Date   Carpal tunnel syndrome    Chest pain    normal coronaries  07/09/2024   Depression    Heart murmur    07/02/2024 TTE: severe AS, mild AI, mild-moderate MR   Hemorrhoids    HTN (hypertension)    Obesity    T wave inversion in EKG     Tobacco Use: Social History   Tobacco Use  Smoking Status Never  Smokeless Tobacco Not on file    Labs: Review Flowsheet       Latest Ref Rng & Units 07/18/2013 07/09/2024 07/22/2024 07/23/2024  Labs for ITP Cardiac and Pulmonary Rehab  Cholestrol 0 - 200 mg/dL 871  - - -  LDL (calc) 0 - 99 mg/dL 77  - - -  HDL-C >60 mg/dL 42  - - -  Trlycerides <150 mg/dL 45  - - -  Hemoglobin J8r 4.8 - 5.6 % 5.6  - 4.8  -  PH, Arterial 7.35 - 7.45 - 7.365  - 7.333  7.316  7.355  7.419  7.406  7.367  7.235  7.329  7.333   PCO2 arterial 32 - 48 mmHg - 45.1  - 40.3  42.9  38.6  35.6  38.3  41.3  57.3  49.7  49.7   Bicarbonate 20.0 - 28.0 mmol/L - 25.8  25.9  - 21.3  21.8  21.8  23.0  24.1  23.7  24.3  25.3  26.2  26.4   TCO2 22 - 32 mmol/L - 27  27  - 22  23  23  24  24  25  25   23  25  26  25  27  28  24  28  26    Acid-base deficit 0.0 - 2.0 mmol/L - 1.0  - 4.0  4.0  4.0  1.0  1.0  2.0  4.0  1.0   O2 Saturation % - 95  62  - 94  94  95  100  100  100  100  90  100  100     Details       Multiple values from one day are sorted in reverse-chronological order         Capillary Blood Glucose: Lab Results  Component Value Date   GLUCAP 91 07/28/2024   GLUCAP 88 07/28/2024   GLUCAP 126 (H) 07/27/2024   GLUCAP 112 (H) 07/27/2024   GLUCAP 121 (H) 07/27/2024     Exercise Target Goals: Exercise Program Goal: Individual exercise prescription set using results from initial 6 min walk test and THRR while considering  patient's activity barriers and safety.   Exercise Prescription Goal: Initial exercise prescription builds to 30-45 minutes a day of aerobic activity, 2-3 days per week.  Home exercise guidelines will be given to patient during program as part of exercise prescription that the participant will  acknowledge.  Activity Barriers & Risk Stratification:  Activity Barriers & Cardiac Risk Stratification - 09/14/24 1349       Activity Barriers & Cardiac Risk Stratification   Activity Barriers Balance Concerns;Deconditioning;Back Problems;Muscular Weakness    Cardiac Risk Stratification High          6 Minute Walk:  6 Minute Walk     Row Name 09/14/24 1149         6 Minute Walk   Phase Initial     Distance 1320 feet     Walk Time 6 minutes     # of Rest Breaks 0     MPH 2.5     METS 2.8     RPE 11     Perceived Dyspnea  0     VO2 Peak 9.71     Symptoms No     Resting HR 77 bpm     Resting BP 118/58     Resting Oxygen Saturation  95 %     Exercise Oxygen Saturation  during 6 min walk 95 %     Max Ex. HR 84 bpm     Max Ex. BP 124/78     2 Minute Post BP 110/72        Oxygen Initial Assessment:   Oxygen Re-Evaluation:   Oxygen Discharge (Final Oxygen Re-Evaluation):   Initial Exercise Prescription:  Initial Exercise Prescription - 09/14/24 1300       Date of Initial Exercise RX and Referring Provider   Date 09/15/24    Referring Provider Alm Clay, MD    Expected Discharge Date 12/08/24      Recumbant Bike   Level 1    RPM 60    Watts 25    Minutes 15    METs 2.8      T5 Nustep   Level 1    SPM 75    Minutes 15    METs 2.8      Prescription Details   Frequency (times per week) 3    Duration Progress to 30 minutes of continuous aerobic without signs/symptoms of physical distress      Intensity   THRR 40-80% of Max Heartrate 65-131    Ratings of Perceived Exertion 11-13  Perceived Dyspnea 0-4      Progression   Progression Continue progressive overload as per policy without signs/symptoms or physical distress.      Resistance Training   Training Prescription Yes    Weight 3 lbs    Reps 10-15          Perform Capillary Blood Glucose checks as needed.  Exercise Prescription Changes:   Exercise Prescription Changes      Row Name 09/20/24 1600 10/04/24 1600           Response to Exercise   Blood Pressure (Admit) 124/68 120/70      Blood Pressure (Exercise) 145/78 130/68      Blood Pressure (Exit) 104/62 118/64      Heart Rate (Admit) 62 bpm 73 bpm      Heart Rate (Exercise) 81 bpm 113 bpm      Heart Rate (Exit) 68 bpm 67 bpm      Rating of Perceived Exertion (Exercise) 11 11      Symptoms None None      Comments Pt's first day in the CRP2 program Reviewed METs      Duration Continue with 30 min of aerobic exercise without signs/symptoms of physical distress. Continue with 30 min of aerobic exercise without signs/symptoms of physical distress.      Intensity THRR unchanged THRR unchanged        Progression   Progression Continue to progress workloads to maintain intensity without signs/symptoms of physical distress. Continue to progress workloads to maintain intensity without signs/symptoms of physical distress.      Average METs 1.9 3        Resistance Training   Training Prescription Yes Yes      Weight 3 lbs 3 lbs      Reps 10-15 10-15      Time 5 Minutes 5 Minutes        Interval Training   Interval Training No No        Recumbant Bike   Level 1 1      RPM 54 84      Watts 10 26      Minutes 15 15      METs 1.9 2.3        NuStep   Level 1 1      SPM 69 121      Minutes 15 15      METs 1.9 3.8         Exercise Comments:   Exercise Comments     Row Name 09/20/24 1629 10/04/24 1644         Exercise Comments Pt's first day in the CRP2 program. Pt exercise without complaints; tolerated session well. Reviewed METs with patient today. Pt is making good progress.         Exercise Goals and Review:   Exercise Goals     Row Name 09/14/24 1059             Exercise Goals   Increase Physical Activity Yes       Intervention Provide advice, education, support and counseling about physical activity/exercise needs.;Develop an individualized exercise prescription for aerobic and  resistive training based on initial evaluation findings, risk stratification, comorbidities and participant's personal goals.       Expected Outcomes Short Term: Attend rehab on a regular basis to increase amount of physical activity.;Long Term: Add in home exercise to make exercise part of routine and to increase amount of physical activity.;Long Term:  Exercising regularly at least 3-5 days a week.       Increase Strength and Stamina Yes       Intervention Provide advice, education, support and counseling about physical activity/exercise needs.;Develop an individualized exercise prescription for aerobic and resistive training based on initial evaluation findings, risk stratification, comorbidities and participant's personal goals.       Expected Outcomes Short Term: Increase workloads from initial exercise prescription for resistance, speed, and METs.;Short Term: Perform resistance training exercises routinely during rehab and add in resistance training at home;Long Term: Improve cardiorespiratory fitness, muscular endurance and strength as measured by increased METs and functional capacity ( )       Able to understand and use rate of perceived exertion (RPE) scale Yes       Intervention Provide education and explanation on how to use RPE scale       Expected Outcomes Long Term:  Able to use RPE to guide intensity level when exercising independently;Short Term: Able to use RPE daily in rehab to express subjective intensity level       Knowledge and understanding of Target Heart Rate Range (THRR) Yes       Intervention Provide education and explanation of THRR including how the numbers were predicted and where they are located for reference       Expected Outcomes Short Term: Able to state/look up THRR;Long Term: Able to use THRR to govern intensity when exercising independently;Short Term: Able to use daily as guideline for intensity in rehab       Understanding of Exercise Prescription Yes        Intervention Provide education, explanation, and written materials on patient's individual exercise prescription       Expected Outcomes Short Term: Able to explain program exercise prescription;Long Term: Able to explain home exercise prescription to exercise independently          Exercise Goals Re-Evaluation :  Exercise Goals Re-Evaluation     Row Name 09/20/24 1627             Exercise Goal Re-Evaluation   Exercise Goals Review Increase Physical Activity;Increase Strength and Stamina;Able to understand and use rate of perceived exertion (RPE) scale;Knowledge and understanding of Target Heart Rate Range (THRR);Understanding of Exercise Prescription       Comments Pt's first day in the CRP2 program. Pt understands the exercise Rx, RPE sclae and THRR.       Expected Outcomes Will continue to monitor the patient and progress workloads as tolerated.          Discharge Exercise Prescription (Final Exercise Prescription Changes):  Exercise Prescription Changes - 10/04/24 1600       Response to Exercise   Blood Pressure (Admit) 120/70    Blood Pressure (Exercise) 130/68    Blood Pressure (Exit) 118/64    Heart Rate (Admit) 73 bpm    Heart Rate (Exercise) 113 bpm    Heart Rate (Exit) 67 bpm    Rating of Perceived Exertion (Exercise) 11    Symptoms None    Comments Reviewed METs    Duration Continue with 30 min of aerobic exercise without signs/symptoms of physical distress.    Intensity THRR unchanged      Progression   Progression Continue to progress workloads to maintain intensity without signs/symptoms of physical distress.    Average METs 3      Resistance Training   Training Prescription Yes    Weight 3 lbs    Reps 10-15  Time 5 Minutes      Interval Training   Interval Training No      Recumbant Bike   Level 1    RPM 84    Watts 26    Minutes 15    METs 2.3      NuStep   Level 1    SPM 121    Minutes 15    METs 3.8          Nutrition:  Target  Goals: Understanding of nutrition guidelines, daily intake of sodium 1500mg , cholesterol 200mg , calories 30% from fat and 7% or less from saturated fats, daily to have 5 or more servings of fruits and vegetables.  Biometrics:   Post Biometrics - 09/14/24 1110        Post  Biometrics   Waist Circumference 53.5 inches    Hip Circumference 52 inches    Waist to Hip Ratio 1.03 %    Triceps Skinfold 23 mm    % Body Fat 39.1 %    Grip Strength 23 kg    Flexibility 0 in    Single Leg Stand 6.93 seconds          Nutrition Therapy Plan and Nutrition Goals:  Nutrition Therapy & Goals - 09/20/24 1141       Nutrition Therapy   Diet Heart Healthy    Drug/Food Interactions Coumadin /Vit K      Personal Nutrition Goals   Nutrition Goal Patient to identify strategies for reducing cardiovascular risk by attending the Pritikin education and nutrition series weekly.    Personal Goal #2 Patient to improve diet quality by using the plate method as a guide for meal planning to include lean protein/plant protein, fruits, vegetables, whole grains, nonfat dairy as part of a well-balanced diet.    Personal Goal #3 Patient to identify strategies for weight loss with goal of 0.5-2 # per week of weight loss.    Comments Patient with history of HTN s/p aortic valve replacement on 07/23/24. Pt states diet largely consists of processed foods and take-out which contains significant sodium as well as added fat and sugar. Reports relying on products such as Lunchables for work due to convenience. RD provided suggestions for easy-to-prep meals including alternatives to high sodium foods such as lightly seasoned chicken breast, no salt added tuna. Current BMI in obese category; however 0.8 lb wt loss noted over past week. Patient will benefit from participation in intensive cardiac rehab for nutrition education, exercise, and lifestyle modification.      Intervention Plan   Intervention Prescribe, educate and counsel  regarding individualized specific dietary modifications aiming towards targeted core components such as weight, hypertension, lipid management, diabetes, heart failure and other comorbidities.    Expected Outcomes Short Term Goal: Understand basic principles of dietary content, such as calories, fat, sodium, cholesterol and nutrients.;Long Term Goal: Adherence to prescribed nutrition plan.          Nutrition Assessments:  MEDIFICTS Score Key: >=70 Need to make dietary changes  40-70 Heart Healthy Diet <= 40 Therapeutic Level Cholesterol Diet   Flowsheet Row INTENSIVE CARDIAC REHAB from 09/20/2024 in [facility] for Heart, Vascular, & Lung Health  Picture Your Plate Total Score on Admission 38   Picture Your Plate Scores: <59 Unhealthy dietary pattern with much room for improvement. 41-50 Dietary pattern unlikely to meet recommendations for good health and room for improvement. 51-60 More healthful dietary pattern, with some room for improvement.  >60 Healthy dietary  pattern, although there may be some specific behaviors that could be improved.    Nutrition Goals Re-Evaluation:   Nutrition Goals Re-Evaluation:   Nutrition Goals Discharge (Final Nutrition Goals Re-Evaluation):   Psychosocial: Target Goals: Acknowledge presence or absence of significant depression and/or stress, maximize coping skills, provide positive support system. Participant is able to verbalize types and ability to use techniques and skills needed for reducing stress and depression.  Initial Review & Psychosocial Screening:  Initial Psych Review & Screening - 09/14/24 1356       Initial Review   Current issues with History of Depression;Current Psychotropic Meds;Current Sleep Concerns      Family Dynamics   Good Support System? Yes   Pt has spouse and 2 adult sons     Barriers   Psychosocial barriers to participate in program The patient should benefit from training in stress  management and relaxation.      Screening Interventions   Interventions Encouraged to exercise    Expected Outcomes Short Term goal: Utilizing psychosocial counselor, staff and physician to assist with identification of specific Stressors or current issues interfering with healing process. Setting desired goal for each stressor or current issue identified.;Long Term Goal: Stressors or current issues are controlled or eliminated.;Short Term goal: Identification and review with participant of any Quality of Life or Depression concerns found by scoring the questionnaire.;Long Term goal: The participant improves quality of Life and PHQ9 Scores as seen by post scores and/or verbalization of changes          Quality of Life Scores:  Quality of Life - 09/14/24 1100       Quality of Life   Select Quality of Life      Quality of Life Scores   Health/Function Pre 24.67 %    Socioeconomic Pre 27.71 %    Psych/Spiritual Pre 27.86 %    Family Pre 26.4 %    GLOBAL Pre 26.21 %         Scores of 19 and below usually indicate a poorer quality of life in these areas.  A difference of  2-3 points is a clinically meaningful difference.  A difference of 2-3 points in the total score of the Quality of Life Index has been associated with significant improvement in overall quality of life, self-image, physical symptoms, and general health in studies assessing change in quality of life.  PHQ-9: Review Flowsheet       09/14/2024  Depression screen PHQ 2/9  Decreased Interest 0  Down, Depressed, Hopeless 0  PHQ - 2 Score 0  Altered sleeping 1  Tired, decreased energy 0  Change in appetite 0  Feeling bad or failure about yourself  0  Trouble concentrating 0  Moving slowly or fidgety/restless 0  Suicidal thoughts 0  PHQ-9 Score 1  Difficult doing work/chores Not difficult at all   Interpretation of Total Score  Total Score Depression Severity:  1-4 = Minimal depression, 5-9 = Mild depression,  10-14 = Moderate depression, 15-19 = Moderately severe depression, 20-27 = Severe depression   Psychosocial Evaluation and Intervention:   Psychosocial Re-Evaluation:  Psychosocial Re-Evaluation     Row Name 09/28/24 1416 10/05/24 1235           Psychosocial Re-Evaluation   Current issues with History of Depression;Current Psychotropic Meds;Current Sleep Concerns History of Depression;Current Psychotropic Meds;Current Sleep Concerns      Comments Jerry Parrish started cardiac rehab on 09/20/24. Jerry Parrish has not voiced any increased concerns or stressors during exercise  at cardiac rehab Reviewed PHQ9. Jerry Parrish says his sleep is improving. Jerry Parrish is going back to work on December 15 then will be out for 2 weeks due to the holidays. Jerry Parrish says that participating in cardiac rehab has been helpful for him.      Interventions Stress management education;Relaxation education;Encouraged to attend Cardiac Rehabilitation for the exercise Stress management education;Relaxation education;Encouraged to attend Cardiac Rehabilitation for the exercise      Continue Psychosocial Services  Follow up required by staff No Follow up required         Psychosocial Discharge (Final Psychosocial Re-Evaluation):  Psychosocial Re-Evaluation - 10/05/24 1235       Psychosocial Re-Evaluation   Current issues with History of Depression;Current Psychotropic Meds;Current Sleep Concerns    Comments Reviewed PHQ9. Jerry Parrish says his sleep is improving. Jerry Parrish is going back to work on December 15 then will be out for 2 weeks due to the holidays. Jerry Parrish says that participating in cardiac rehab has been helpful for him.    Interventions Stress management education;Relaxation education;Encouraged to attend Cardiac Rehabilitation for the exercise    Continue Psychosocial Services  No Follow up required          Vocational Rehabilitation: Provide vocational rehab assistance to qualifying candidates.   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 To return to work 10/18/24         Education: Education Goals: Education classes will be provided on a weekly basis, covering required topics. Participant will state understanding/return demonstration of topics presented.    Education     Row Name 09/20/24 0800     Education   Cardiac Education Topics Pritikin   Psychologist, Forensic Exercise Education   Exercise Education Move It!   Instruction Review Code 1- Verbalizes Understanding   Class Start Time 0815   Class Stop Time 0854   Class Time Calculation (min) 39 min    Row Name 09/22/24 0900     Education   Cardiac Education Topics Pritikin   Secondary School Teacher School   Educator Dietitian   Weekly Topic Efficiency Cooking - Meals in a Snap   Instruction Review Code 1- Verbalizes Understanding   Class Start Time 0815   Class Stop Time 0904   Class Time Calculation (min) 49 min    Row Name 09/24/24 0800     Education   Cardiac Education Topics Pritikin   Select Workshops     Workshops   Educator Exercise Physiologist   Select Exercise   Exercise Workshop Exercise Basics: Building Your Action Plan   Nutrition Workshop --   Instruction Review Code 1- Verbalizes Understanding   Class Start Time 0815   Class Stop Time 0851   Class Time Calculation (min) 36 min    Row Name 09/27/24 0800     Education   Cardiac Education Topics Pritikin   Glass Blower/designer Nutrition   Nutrition Workshop Targeting Your Nutrition Priorities   Instruction Review Code 1- Verbalizes Understanding   Class Start Time 0815   Class Stop Time 0901   Class Time Calculation (min) 46 min  Row Name 09/29/24 0900     Education    Cardiac Education Topics Pritikin   Secondary School Teacher School   Educator Dietitian   Weekly Topic One-Pot Wonders   Instruction Review Code 1- Verbalizes Understanding   Class Start Time [phone]   Class Stop Time [phone]   Class Time Calculation (min) 44 min    Row Name 10/04/24 0800     Education   Cardiac Education Topics Pritikin   Select Workshops     Workshops   Educator Exercise Physiologist   Select Psychosocial   Psychosocial Workshop Focused Goals, Sustainable Changes   Instruction Review Code 1- Verbalizes Understanding   Class Start Time 0815   Class Stop Time 0850   Class Time Calculation (min) 35 min      Core Videos: Exercise    Move It!  Clinical staff conducted group or individual video education with verbal and written material and guidebook.  Patient learns the recommended Pritikin exercise program. Exercise with the goal of living a long, healthy life. Some of the health benefits of exercise include controlled diabetes, healthier blood pressure levels, improved cholesterol levels, improved heart and lung capacity, improved sleep, and better body composition. Everyone should speak with their doctor before starting or changing an exercise routine.  Biomechanical Limitations Clinical staff conducted group or individual video education with verbal and written material and guidebook.  Patient learns how biomechanical limitations can impact exercise and how we can mitigate and possibly overcome limitations to have an impactful and balanced exercise routine.  Body Composition Clinical staff conducted group or individual video education with verbal and written material and guidebook.  Patient learns that body composition (ratio of muscle mass to fat mass) is a key component to assessing overall fitness, rather than body weight alone. Increased fat mass, especially visceral belly fat, can put us  at increased risk for metabolic syndrome, type 2 diabetes,  heart disease, and even death. It is recommended to combine diet and exercise (cardiovascular and resistance training) to improve your body composition. Seek guidance from your physician and exercise physiologist before implementing an exercise routine.  Exercise Action Plan Clinical staff conducted group or individual video education with verbal and written material and guidebook.  Patient learns the recommended strategies to achieve and enjoy long-term exercise adherence, including variety, self-motivation, self-efficacy, and positive decision making. Benefits of exercise include fitness, good health, weight management, more energy, better sleep, less stress, and overall well-being.  Medical   Heart Disease Risk Reduction Clinical staff conducted group or individual video education with verbal and written material and guidebook.  Patient learns our heart is our most vital organ as it circulates oxygen, nutrients, white blood cells, and hormones throughout the entire body, and carries waste away. Data supports a plant-based eating plan like the Pritikin Program for its effectiveness in slowing progression of and reversing heart disease. The video provides a number of recommendations to address heart disease.   Metabolic Syndrome and Belly Fat  Clinical staff conducted group or individual video education with verbal and written material and guidebook.  Patient learns what metabolic syndrome is, how it leads to heart disease, and how one can reverse it and keep it from coming back. You have metabolic syndrome if you have 3 of the following 5 criteria: abdominal obesity, high blood pressure, high triglycerides, low HDL cholesterol, and high blood sugar.  Hypertension and Heart Disease Clinical staff conducted group or individual video education with verbal and  written material and guidebook.  Patient learns that high blood pressure, or hypertension, is very common in the [place] . Hypertension is  largely due to excessive salt intake, but other important risk factors include being overweight, physical inactivity, drinking too much alcohol, smoking, and not eating enough potassium from fruits and vegetables. High blood pressure is a leading risk factor for heart attack, stroke, congestive heart failure, dementia, kidney failure, and premature death. Long-term effects of excessive salt intake include stiffening of the arteries and thickening of heart muscle and organ damage. Recommendations include ways to reduce hypertension and the risk of heart disease.  Diseases of Our Time - Focusing on Diabetes Clinical staff conducted group or individual video education with verbal and written material and guidebook.  Patient learns why the best way to stop diseases of our time is prevention, through food and other lifestyle changes. Medicine (such as prescription pills and surgeries) is often only a Band-Aid on the problem, not a long-term solution. Most common diseases of our time include obesity, type 2 diabetes, hypertension, heart disease, and cancer. The Pritikin Program is recommended and has been proven to help reduce, reverse, and/or prevent the damaging effects of metabolic syndrome.  Nutrition   Overview of the Pritikin Eating Plan  Clinical staff conducted group or individual video education with verbal and written material and guidebook.  Patient learns about the Pritikin Eating Plan for disease risk reduction. The Pritikin Eating Plan emphasizes a wide variety of unrefined, minimally-processed carbohydrates, like fruits, vegetables, whole grains, and legumes. Go, Caution, and Stop food choices are explained. Plant-based and lean animal proteins are emphasized. Rationale provided for low sodium intake for blood pressure control, low added sugars for blood sugar stabilization, and low added fats and oils for coronary artery disease risk reduction and weight management.  Calorie Density  Clinical  staff conducted group or individual video education with verbal and written material and guidebook.  Patient learns about calorie density and how it impacts the Pritikin Eating Plan. Knowing the characteristics of the food you choose will help you decide whether those foods will lead to weight gain or weight loss, and whether you want to consume more or less of them. Weight loss is usually a side effect of the Pritikin Eating Plan because of its focus on low calorie-dense foods.  Label Reading  Clinical staff conducted group or individual video education with verbal and written material and guidebook.  Patient learns about the Pritikin recommended label reading guidelines and corresponding recommendations regarding calorie density, added sugars, sodium content, and whole grains.  Dining Out - Part 1  Clinical staff conducted group or individual video education with verbal and written material and guidebook.  Patient learns that restaurant meals can be sabotaging because they can be so high in calories, fat, sodium, and/or sugar. Patient learns recommended strategies on how to positively address this and avoid unhealthy pitfalls.  Facts on Fats  Clinical staff conducted group or individual video education with verbal and written material and guidebook.  Patient learns that lifestyle modifications can be just as effective, if not more so, as many medications for lowering your risk of heart disease. A Pritikin lifestyle can help to reduce your risk of inflammation and atherosclerosis (cholesterol build-up, or plaque, in the artery walls). Lifestyle interventions such as dietary choices and physical activity address the cause of atherosclerosis. A review of the types of fats and their impact on blood cholesterol levels, along with dietary recommendations to reduce fat intake is  also included.  Nutrition Action Plan  Clinical staff conducted group or individual video education with verbal and written  material and guidebook.  Patient learns how to incorporate Pritikin recommendations into their lifestyle. Recommendations include planning and keeping personal health goals in mind as an important part of their success.  Healthy Mind-Set    Healthy Minds, Bodies, Hearts  Clinical staff conducted group or individual video education with verbal and written material and guidebook.  Patient learns how to identify when they are stressed. Video will discuss the impact of that stress, as well as the many benefits of stress management. Patient will also be introduced to stress management techniques. The way we think, act, and feel has an impact on our hearts.  How Our Thoughts Can Heal Our Hearts  Clinical staff conducted group or individual video education with verbal and written material and guidebook.  Patient learns that negative thoughts can cause depression and anxiety. This can result in negative lifestyle behavior and serious health problems. Cognitive behavioral therapy is an effective method to help control our thoughts in order to change and improve our emotional outlook.  Additional Videos:  Exercise    Improving Performance  Clinical staff conducted group or individual video education with verbal and written material and guidebook.  Patient learns to use a non-linear approach by alternating intensity levels and lengths of time spent exercising to help burn more calories and lose more body fat. Cardiovascular exercise helps improve heart health, metabolism, hormonal balance, blood sugar control, and recovery from fatigue. Resistance training improves strength, endurance, balance, coordination, reaction time, metabolism, and muscle mass. Flexibility exercise improves circulation, posture, and balance. Seek guidance from your physician and exercise physiologist before implementing an exercise routine and learn your capabilities and proper form for all exercise.  Introduction to Yoga  Clinical  staff conducted group or individual video education with verbal and written material and guidebook.  Patient learns about yoga, a discipline of the coming together of mind, breath, and body. The benefits of yoga include improved flexibility, improved range of motion, better posture and core strength, increased lung function, weight loss, and positive self-image. Yoga's heart health benefits include lowered blood pressure, healthier heart rate, decreased cholesterol and triglyceride levels, improved immune function, and reduced stress. Seek guidance from your physician and exercise physiologist before implementing an exercise routine and learn your capabilities and proper form for all exercise.  Medical   Aging: Enhancing Your Quality of Life  Clinical staff conducted group or individual video education with verbal and written material and guidebook.  Patient learns key strategies and recommendations to stay in good physical health and enhance quality of life, such as prevention strategies, having an advocate, securing a Health Care Proxy and Power of Attorney, and keeping a list of medications and system for tracking them. It also discusses how to avoid risk for bone loss.  Biology of Weight Control  Clinical staff conducted group or individual video education with verbal and written material and guidebook.  Patient learns that weight gain occurs because we consume more calories than we burn (eating more, moving less). Even if your body weight is normal, you may have higher ratios of fat compared to muscle mass. Too much body fat puts you at increased risk for cardiovascular disease, heart attack, stroke, type 2 diabetes, and obesity-related cancers. In addition to exercise, following the Pritikin Eating Plan can help reduce your risk.  Decoding Lab Results  Clinical staff conducted group or individual video education with  verbal and written material and guidebook.  Patient learns that lab test  reflects one measurement whose values change over time and are influenced by many factors, including medication, stress, sleep, exercise, food, hydration, pre-existing medical conditions, and more. It is recommended to use the knowledge from this video to become more involved with your lab results and evaluate your numbers to speak with your doctor.   Diseases of Our Time - Overview  Clinical staff conducted group or individual video education with verbal and written material and guidebook.  Patient learns that according to the CDC, 50% to 70% of chronic diseases (such as obesity, type 2 diabetes, elevated lipids, hypertension, and heart disease) are avoidable through lifestyle improvements including healthier food choices, listening to satiety cues, and increased physical activity.  Sleep Disorders Clinical staff conducted group or individual video education with verbal and written material and guidebook.  Patient learns how good quality and duration of sleep are important to overall health and well-being. Patient also learns about sleep disorders and how they impact health along with recommendations to address them, including discussing with a physician.  Nutrition  Dining Out - Part 2 Clinical staff conducted group or individual video education with verbal and written material and guidebook.  Patient learns how to plan ahead and communicate in order to maximize their dining experience in a healthy and nutritious manner. Included are recommended food choices based on the type of restaurant the patient is visiting.   Fueling a Banker conducted group or individual video education with verbal and written material and guidebook.  There is a strong connection between our food choices and our health. Diseases like obesity and type 2 diabetes are very prevalent and are in large-part due to lifestyle choices. The Pritikin Eating Plan provides plenty of food and hunger-curbing  satisfaction. It is easy to follow, affordable, and helps reduce health risks.  Menu Workshop  Clinical staff conducted group or individual video education with verbal and written material and guidebook.  Patient learns that restaurant meals can sabotage health goals because they are often packed with calories, fat, sodium, and sugar. Recommendations include strategies to plan ahead and to communicate with the manager, chef, or server to help order a healthier meal.  Planning Your Eating Strategy  Clinical staff conducted group or individual video education with verbal and written material and guidebook.  Patient learns about the Pritikin Eating Plan and its benefit of reducing the risk of disease. The Pritikin Eating Plan does not focus on calories. Instead, it emphasizes high-quality, nutrient-rich foods. By knowing the characteristics of the foods, we choose, we can determine their calorie density and make informed decisions.  Targeting Your Nutrition Priorities  Clinical staff conducted group or individual video education with verbal and written material and guidebook.  Patient learns that lifestyle habits have a tremendous impact on disease risk and progression. This video provides eating and physical activity recommendations based on your personal health goals, such as reducing LDL cholesterol, losing weight, preventing or controlling type 2 diabetes, and reducing high blood pressure.  Vitamins and Minerals  Clinical staff conducted group or individual video education with verbal and written material and guidebook.  Patient learns different ways to obtain key vitamins and minerals, including through a recommended healthy diet. It is important to discuss all supplements you take with your doctor.   Healthy Mind-Set    Smoking Cessation  Clinical staff conducted group or individual video education with verbal and written material and  guidebook.  Patient learns that cigarette smoking and  tobacco addiction pose a serious health risk which affects millions of people. Stopping smoking will significantly reduce the risk of heart disease, lung disease, and many forms of cancer. Recommended strategies for quitting are covered, including working with your doctor to develop a successful plan.  Culinary   Becoming a Set Designer conducted group or individual video education with verbal and written material and guidebook.  Patient learns that cooking at home can be healthy, cost-effective, quick, and puts them in control. Keys to cooking healthy recipes will include looking at your recipe, assessing your equipment needs, planning ahead, making it simple, choosing cost-effective seasonal ingredients, and limiting the use of added fats, salts, and sugars.  Cooking - Breakfast and Snacks  Clinical staff conducted group or individual video education with verbal and written material and guidebook.  Patient learns how important breakfast is to satiety and nutrition through the entire day. Recommendations include key foods to eat during breakfast to help stabilize blood sugar levels and to prevent overeating at meals later in the day. Planning ahead is also a key component.  Cooking - Educational Psychologist conducted group or individual video education with verbal and written material and guidebook.  Patient learns eating strategies to improve overall health, including an approach to cook more at home. Recommendations include thinking of animal protein as a side on your plate rather than center stage and focusing instead on lower calorie dense options like vegetables, fruits, whole grains, and plant-based proteins, such as beans. Making sauces in large quantities to freeze for later and leaving the skin on your vegetables are also recommended to maximize your experience.  Cooking - Healthy Salads and Dressing Clinical staff conducted group or individual video education with  verbal and written material and guidebook.  Patient learns that vegetables, fruits, whole grains, and legumes are the foundations of the Pritikin Eating Plan. Recommendations include how to incorporate each of these in flavorful and healthy salads, and how to create homemade salad dressings. Proper handling of ingredients is also covered. Cooking - Soups and [employer] - Soups and Desserts Clinical staff conducted group or individual video education with verbal and written material and guidebook.  Patient learns that Pritikin soups and desserts make for easy, nutritious, and delicious snacks and meal components that are low in sodium, fat, sugar, and calorie density, while high in vitamins, minerals, and filling fiber. Recommendations include simple and healthy ideas for soups and desserts.   Overview     The Pritikin Solution Program Overview Clinical staff conducted group or individual video education with verbal and written material and guidebook.  Patient learns that the results of the Pritikin Program have been documented in more than 100 articles published in peer-reviewed journals, and the benefits include reducing risk factors for (and, in some cases, even reversing) high cholesterol, high blood pressure, type 2 diabetes, obesity, and more! An overview of the three key pillars of the Pritikin Program will be covered: eating well, doing regular exercise, and having a healthy mind-set.  WORKSHOPS  Exercise: Exercise Basics: Building Your Action Plan Clinical staff led group instruction and group discussion with PowerPoint presentation and patient guidebook. To enhance the learning environment the use of posters, models and videos may be added. At the conclusion of this workshop, patients will comprehend the difference between physical activity and exercise, as well as the benefits of incorporating both, into their routine. Patients will  understand the FITT (Frequency, Intensity, Time,  and Type) principle and how to use it to build an exercise action plan. In addition, safety concerns and other considerations for exercise and cardiac rehab will be addressed by the presenter. The purpose of this lesson is to promote a comprehensive and effective weekly exercise routine in order to improve patients' overall level of fitness.   Managing Heart Disease: Your Path to a Healthier Heart Clinical staff led group instruction and group discussion with PowerPoint presentation and patient guidebook. To enhance the learning environment the use of posters, models and videos may be added.At the conclusion of this workshop, patients will understand the anatomy and physiology of the heart. Additionally, they will understand how Pritikin's three pillars impact the risk factors, the progression, and the management of heart disease.  The purpose of this lesson is to provide a high-level overview of the heart, heart disease, and how the Pritikin lifestyle positively impacts risk factors.  Exercise Biomechanics Clinical staff led group instruction and group discussion with PowerPoint presentation and patient guidebook. To enhance the learning environment the use of posters, models and videos may be added. Patients will learn how the structural parts of their bodies function and how these functions impact their daily activities, movement, and exercise. Patients will learn how to promote a neutral spine, learn how to manage pain, and identify ways to improve their physical movement in order to promote healthy living. The purpose of this lesson is to expose patients to common physical limitations that impact physical activity. Participants will learn practical ways to adapt and manage aches and pains, and to minimize their effect on regular exercise. Patients will learn how to maintain good posture while sitting, walking, and lifting.  Balance Training and Fall Prevention  Clinical staff led group  instruction and group discussion with PowerPoint presentation and patient guidebook. To enhance the learning environment the use of posters, models and videos may be added. At the conclusion of this workshop, patients will understand the importance of their sensorimotor skills (vision, proprioception, and the vestibular system) in maintaining their ability to balance as they age. Patients will apply a variety of balancing exercises that are appropriate for their current level of function. Patients will understand the common causes for poor balance, possible solutions to these problems, and ways to modify their physical environment in order to minimize their fall risk. The purpose of this lesson is to teach patients about the importance of maintaining balance as they age and ways to minimize their risk of falling.  WORKSHOPS   Nutrition:  Fueling a Ship Broker led group instruction and group discussion with PowerPoint presentation and patient guidebook. To enhance the learning environment the use of posters, models and videos may be added. Patients will review the foundational principles of the Pritikin Eating Plan and understand what constitutes a serving size in each of the food groups. Patients will also learn Pritikin-friendly foods that are better choices when away from home and review make-ahead meal and snack options. Calorie density will be reviewed and applied to three nutrition priorities: weight maintenance, weight loss, and weight gain. The purpose of this lesson is to reinforce (in a group setting) the key concepts around what patients are recommended to eat and how to apply these guidelines when away from home by planning and selecting Pritikin-friendly options. Patients will understand how calorie density may be adjusted for different weight management goals.  Mindful Eating  Clinical staff led group instruction and group discussion  with PowerPoint presentation and patient  guidebook. To enhance the learning environment the use of posters, models and videos may be added. Patients will briefly review the concepts of the Pritikin Eating Plan and the importance of low-calorie dense foods. The concept of mindful eating will be introduced as well as the importance of paying attention to internal hunger signals. Triggers for non-hunger eating and techniques for dealing with triggers will be explored. The purpose of this lesson is to provide patients with the opportunity to review the basic principles of the Pritikin Eating Plan, discuss the value of eating mindfully and how to measure internal cues of hunger and fullness using the Hunger Scale. Patients will also discuss reasons for non-hunger eating and learn strategies to use for controlling emotional eating.  Targeting Your Nutrition Priorities Clinical staff led group instruction and group discussion with PowerPoint presentation and patient guidebook. To enhance the learning environment the use of posters, models and videos may be added. Patients will learn how to determine their genetic susceptibility to disease by reviewing their family history. Patients will gain insight into the importance of diet as part of an overall healthy lifestyle in mitigating the impact of genetics and other environmental insults. The purpose of this lesson is to provide patients with the opportunity to assess their personal nutrition priorities by looking at their family history, their own health history and current risk factors. Patients will also be able to discuss ways of prioritizing and modifying the Pritikin Eating Plan for their highest risk areas  Menu  Clinical staff led group instruction and group discussion with PowerPoint presentation and patient guidebook. To enhance the learning environment the use of posters, models and videos may be added. Using menus brought in from [employer], or printed from [employer], patients will apply  the Pritikin dining out guidelines that were presented in the [employer] video. Patients will also be able to practice these guidelines in a variety of provided scenarios. The purpose of this lesson is to provide patients with the opportunity to practice hands-on learning of the Pritikin Dining Out guidelines with actual menus and practice scenarios.  Label Reading Clinical staff led group instruction and group discussion with PowerPoint presentation and patient guidebook. To enhance the learning environment the use of posters, models and videos may be added. Patients will review and discuss the Pritikin label reading guidelines presented in Pritikin's Label Reading Educational series video. Using fool labels brought in from local grocery stores and markets, patients will apply the label reading guidelines and determine if the packaged food meet the Pritikin guidelines. The purpose of this lesson is to provide patients with the opportunity to review, discuss, and practice hands-on learning of the Pritikin Label Reading guidelines with actual packaged food labels. Cooking School  Pritikin's [employer] are designed to teach patients ways to prepare quick, simple, and affordable recipes at home. The importance of nutrition's role in chronic disease risk reduction is reflected in its emphasis in the overall Pritikin program. By learning how to prepare essential core Pritikin Eating Plan recipes, patients will increase control over what they eat; be able to customize the flavor of foods without the use of added salt, sugar, or fat; and improve the quality of the food they consume. By learning a set of core recipes which are easily assembled, quickly prepared, and affordable, patients are more likely to prepare more healthy foods at home. These workshops focus on convenient breakfasts, simple entres, side dishes, and desserts  which can be prepared with minimal effort and are  consistent with nutrition recommendations for cardiovascular risk reduction. Cooking [employer] are taught by a armed [employer] logistics/support/administrative officer (RD) who has been trained by the [employer]. The chef or RD has a clear understanding of the importance of minimizing - if not completely eliminating - added fat, sugar, and sodium in recipes. Throughout the series of Cooking School Workshop sessions, patients will learn about healthy ingredients and efficient methods of cooking to build confidence in their capability to prepare    Cooking School weekly topics:  Adding Flavor- Sodium-Free  Fast and Healthy Breakfasts  Powerhouse Plant-Based Proteins  Satisfying Salads and Dressings  Simple Sides and Sauces  International Cuisine-Spotlight on the [employer] Zones  Delicious Desserts  Savory Soups  [employer] - Meals in a Astronomer Appetizers and Snacks  Comforting Weekend Breakfasts  One-Pot Wonders   Fast Evening Meals  Landscape Architect Your Pritikin Plate  WORKSHOPS   Healthy Mindset (Psychosocial):  Focused Goals, Sustainable Changes Clinical staff led group instruction and group discussion with PowerPoint presentation and patient guidebook. To enhance the learning environment the use of posters, models and videos may be added. Patients will be able to apply effective goal setting strategies to establish at least one personal goal, and then take consistent, meaningful action toward that goal. They will learn to identify common barriers to achieving personal goals and develop strategies to overcome them. Patients will also gain an understanding of how our mind-set can impact our ability to achieve goals and the importance of cultivating a positive and growth-oriented mind-set. The purpose of this lesson is to provide patients with a deeper understanding of how to set and achieve personal goals, as well as the tools and strategies needed to overcome common  obstacles which may arise along the way.  From Head to Heart: The Power of a Healthy Outlook  Clinical staff led group instruction and group discussion with PowerPoint presentation and patient guidebook. To enhance the learning environment the use of posters, models and videos may be added. Patients will be able to recognize and describe the impact of emotions and mood on physical health. They will discover the importance of self-care and explore self-care practices which may work for them. Patients will also learn how to utilize the 4 C's to cultivate a healthier outlook and better manage stress and challenges. The purpose of this lesson is to demonstrate to patients how a healthy outlook is an essential part of maintaining good health, especially as they continue their cardiac rehab journey.  Healthy Sleep for a Healthy Heart Clinical staff led group instruction and group discussion with PowerPoint presentation and patient guidebook. To enhance the learning environment the use of posters, models and videos may be added. At the conclusion of this workshop, patients will be able to demonstrate knowledge of the importance of sleep to overall health, well-being, and quality of life. They will understand the symptoms of, and treatments for, common sleep disorders. Patients will also be able to identify daytime and nighttime behaviors which impact sleep, and they will be able to apply these tools to help manage sleep-related challenges. The purpose of this lesson is to provide patients with a general overview of sleep and outline the importance of quality sleep. Patients will learn about a few of the most common sleep disorders. Patients will also be introduced to the concept of "sleep hygiene," and discover ways to self-manage certain sleeping problems  through simple daily behavior changes. Finally, the workshop will motivate patients by clarifying the links between quality sleep and their goals of heart-healthy  living.   Recognizing and Reducing Stress Clinical staff led group instruction and group discussion with PowerPoint presentation and patient guidebook. To enhance the learning environment the use of posters, models and videos may be added. At the conclusion of this workshop, patients will be able to understand the types of stress reactions, differentiate between acute and chronic stress, and recognize the impact that chronic stress has on their health. They will also be able to apply different coping mechanisms, such as reframing negative self-talk. Patients will have the opportunity to practice a variety of stress management techniques, such as deep abdominal breathing, progressive muscle relaxation, and/or guided imagery.  The purpose of this lesson is to educate patients on the role of stress in their lives and to provide healthy techniques for coping with it.  Learning Barriers/Preferences:  Learning Barriers/Preferences - 09/14/24 1357       Learning Barriers/Preferences   Learning Barriers Sight    Learning Preferences Computer/Internet;Audio;Group Instruction;Individual Instruction;Pictoral;Skilled Demonstration;Verbal Instruction;Video;Written Material          Education Topics:  Knowledge Questionnaire Score:  Knowledge Questionnaire Score - 09/14/24 1101       Knowledge Questionnaire Score   Pre Score 23/24          Core Components/Risk Factors/Patient Goals at Admission:  Personal Goals and Risk Factors at Admission - 09/14/24 1358       Core Components/Risk Factors/Patient Goals on Admission    Weight Management Yes;Obesity;Weight Loss    Intervention Weight Management: Develop a combined nutrition and exercise program designed to reach desired caloric intake, while maintaining appropriate intake of nutrient and fiber, sodium and fats, and appropriate energy expenditure required for the weight goal.;Weight Management: Provide education and appropriate resources to help  participant work on and attain dietary goals.;Weight Management/Obesity: Establish reasonable short term and long term weight goals.;Obesity: Provide education and appropriate resources to help participant work on and attain dietary goals.    Admit Weight 278 lb 3.5 oz (126.2 kg)    Goal Weight: Long Term 266 lb (120.7 kg)    Expected Outcomes Short Term: Continue to assess and modify interventions until short term weight is achieved;Long Term: Adherence to nutrition and physical activity/exercise program aimed toward attainment of established weight goal;Weight Loss: Understanding of general recommendations for a balanced deficit meal plan, which promotes 1-2 lb weight loss per week and includes a negative energy balance of [phone] kcal/d;Understanding recommendations for meals to include 15-35% energy as protein, 25-35% energy from fat, 35-60% energy from carbohydrates, less than 200mg  of dietary cholesterol, 20-35 gm of total fiber daily;Understanding of distribution of calorie intake throughout the day with the consumption of 4-5 meals/snacks    Hypertension Yes    Intervention Monitor prescription use compliance.;Provide education on lifestyle modifcations including regular physical activity/exercise, weight management, moderate sodium restriction and increased consumption of fresh fruit, vegetables, and low fat dairy, alcohol moderation, and smoking cessation.    Expected Outcomes Short Term: Continued assessment and intervention until BP is < 140/73mm HG in hypertensive participants. < 130/21mm HG in hypertensive participants with diabetes, heart failure or chronic kidney disease.;Long Term: Maintenance of blood pressure at goal levels.          Core Components/Risk Factors/Patient Goals Review:   Goals and Risk Factor Review     Row Name 09/28/24 1431 10/05/24 1238  Core Components/Risk Factors/Patient Goals Review   Personal Goals Review Weight Management/Obesity;Hypertension  Weight Management/Obesity;Hypertension      Review Jerry Parrish has been doing well with exercise. Vital signs have been stable. Jerry Parrish has been doing well with exercise. Vital signs have been stable. Jerry Parrish has increased his workloads      Expected Outcomes Jerry Parrish will continue to participate in cardiac rehab for exercise, nutrition and lifestyle modifications Jerry Parrish will continue to participate in cardiac rehab for exercise, nutrition and lifestyle modifications         Core Components/Risk Factors/Patient Goals at Discharge (Final Review):   Goals and Risk Factor Review - 10/05/24 1238       Core Components/Risk Factors/Patient Goals Review   Personal Goals Review Weight Management/Obesity;Hypertension    Review Jerry Parrish has been doing well with exercise. Vital signs have been stable. Jerry Parrish has increased his workloads    Expected Outcomes Jerry Parrish will continue to participate in cardiac rehab for exercise, nutrition and lifestyle modifications          ITP Comments:  ITP Comments     Row Name 09/14/24 1057 09/28/24 1412 10/05/24 1230       ITP Comments Dr. Wilbert Bihari medical director. Introduction to pritikin education/intensive cardiac rehab. Initial orientation packet reviewed with patient. 30 Day ITP Review. Jerry Parrish started cardiac rehab on 09/20/24. Jerry Parrish is off to a good start to exercise. 30 Day ITP Review. Jerry Parrish started cardiac rehab on 09/20/24. Jerry Parrish has good attendance and participation with exercise at cardiac rehab/        Comments: See ITP comments.Hadassah Elpidio Quan RN BSN

## 2024-10-06 ENCOUNTER — Encounter (HOSPITAL_COMMUNITY)
Admission: RE | Admit: 2024-10-06 | Discharge: 2024-10-06 | Disposition: A | Source: Ambulatory Visit | Attending: Cardiology

## 2024-10-06 DIAGNOSIS — Z952 Presence of prosthetic heart valve: Secondary | ICD-10-CM | POA: Diagnosis not present

## 2024-10-07 ENCOUNTER — Ambulatory Visit: Attending: Cardiology | Admitting: *Deleted

## 2024-10-07 DIAGNOSIS — Z5181 Encounter for therapeutic drug level monitoring: Secondary | ICD-10-CM | POA: Diagnosis not present

## 2024-10-07 DIAGNOSIS — Z7901 Long term (current) use of anticoagulants: Secondary | ICD-10-CM | POA: Diagnosis not present

## 2024-10-07 LAB — POCT INR: POC INR: 1.9

## 2024-10-07 NOTE — Progress Notes (Signed)
 Description   INR 1.9, Today take 1.5 tablets of warfarin then START taking warfarin 1 tablet daily except for 1.5 tablets on Mondays. Please call Coumadin  clinic with any medication or diet changes.  Anticoagulation Clinic [phone].  INR in 2 weeks      Lab Results  Component Value Date   INR 1.9 10/07/2024   INR 1.4 (A) 09/23/2024   INR 1.6 (A) 09/10/2024

## 2024-10-07 NOTE — Patient Instructions (Signed)
 Description   INR 1.9, Today take 1.5 tablets of warfarin then START taking warfarin 1 tablet daily except for 1.5 tablets on Mondays. Please call Coumadin  clinic with any medication or diet changes.  Anticoagulation Clinic [phone].  INR in 2 weeks

## 2024-10-08 ENCOUNTER — Encounter (HOSPITAL_COMMUNITY)
Admission: RE | Admit: 2024-10-08 | Discharge: 2024-10-08 | Disposition: A | Source: Ambulatory Visit | Attending: Cardiology

## 2024-10-08 DIAGNOSIS — Z952 Presence of prosthetic heart valve: Secondary | ICD-10-CM

## 2024-10-11 ENCOUNTER — Encounter (HOSPITAL_COMMUNITY)
Admission: RE | Admit: 2024-10-11 | Discharge: 2024-10-11 | Disposition: A | Source: Ambulatory Visit | Attending: Cardiology

## 2024-10-11 DIAGNOSIS — Z952 Presence of prosthetic heart valve: Secondary | ICD-10-CM

## 2024-10-13 ENCOUNTER — Encounter (HOSPITAL_COMMUNITY)
Admission: RE | Admit: 2024-10-13 | Discharge: 2024-10-13 | Disposition: A | Discharge status: Home / Self Care | Source: Ambulatory Visit | Attending: Cardiology | Admitting: Cardiology

## 2024-10-13 DIAGNOSIS — Z952 Presence of prosthetic heart valve: Secondary | ICD-10-CM

## 2024-10-15 ENCOUNTER — Encounter (HOSPITAL_COMMUNITY)

## 2024-10-15 ENCOUNTER — Encounter (HOSPITAL_COMMUNITY)
Admission: RE | Admit: 2024-10-15 | Discharge: 2024-10-15 | Disposition: A | Source: Ambulatory Visit | Attending: Cardiology

## 2024-10-15 DIAGNOSIS — Z952 Presence of prosthetic heart valve: Secondary | ICD-10-CM

## 2024-10-18 ENCOUNTER — Encounter (HOSPITAL_COMMUNITY): Admission: RE | Admit: 2024-10-18 | Discharge: 2024-10-18 | Attending: Cardiology

## 2024-10-18 ENCOUNTER — Encounter (HOSPITAL_COMMUNITY)

## 2024-10-18 DIAGNOSIS — Z952 Presence of prosthetic heart valve: Secondary | ICD-10-CM | POA: Diagnosis not present

## 2024-10-20 ENCOUNTER — Encounter (HOSPITAL_COMMUNITY): Admission: RE | Admit: 2024-10-20 | Discharge: 2024-10-20 | Attending: Cardiology

## 2024-10-20 ENCOUNTER — Encounter (HOSPITAL_COMMUNITY)

## 2024-10-20 DIAGNOSIS — Z952 Presence of prosthetic heart valve: Secondary | ICD-10-CM

## 2024-10-21 ENCOUNTER — Ambulatory Visit: Attending: Cardiology

## 2024-10-21 ENCOUNTER — Other Ambulatory Visit: Payer: Self-pay

## 2024-10-21 DIAGNOSIS — Z952 Presence of prosthetic heart valve: Secondary | ICD-10-CM

## 2024-10-21 DIAGNOSIS — Z7901 Long term (current) use of anticoagulants: Secondary | ICD-10-CM | POA: Diagnosis not present

## 2024-10-21 DIAGNOSIS — I359 Nonrheumatic aortic valve disorder, unspecified: Secondary | ICD-10-CM | POA: Diagnosis not present

## 2024-10-21 LAB — POCT INR: INR: 1.5 — AB (ref 2.0–3.0)

## 2024-10-21 MED ORDER — WARFARIN SODIUM 2.5 MG PO TABS: 2.5000 mg | ORAL_TABLET | Freq: Every day | ORAL | 6 refills | Status: AC

## 2024-10-21 NOTE — Progress Notes (Signed)
 INR 1.5 Please see anticoagulation encounter Today take 2 tablets of warfarin then continue taking warfarin 1 tablet daily except for 1.5 tablets on Mondays. Please call Coumadin  clinic with any medication or diet changes.  Anticoagulation Clinic [phone].  INR in 3 weeks

## 2024-10-21 NOTE — Patient Instructions (Signed)
 Today take 2 tablets of warfarin then continue taking warfarin 1 tablet daily except for 1.5 tablets on Mondays. Please call Coumadin  clinic with any medication or diet changes.  Anticoagulation Clinic [phone].  INR in 3 weeks

## 2024-10-22 ENCOUNTER — Encounter (HOSPITAL_COMMUNITY)

## 2024-10-22 ENCOUNTER — Encounter (HOSPITAL_COMMUNITY): Admission: RE | Admit: 2024-10-22 | Discharge: 2024-10-22 | Attending: Cardiology

## 2024-10-22 DIAGNOSIS — Z952 Presence of prosthetic heart valve: Secondary | ICD-10-CM | POA: Diagnosis not present

## 2024-10-23 ENCOUNTER — Other Ambulatory Visit: Payer: Self-pay | Admitting: Physician Assistant

## 2024-10-25 ENCOUNTER — Encounter (HOSPITAL_COMMUNITY): Admission: RE | Admit: 2024-10-25 | Discharge: 2024-10-25 | Attending: Cardiology

## 2024-10-25 ENCOUNTER — Telehealth: Payer: Self-pay | Admitting: Student

## 2024-10-25 ENCOUNTER — Encounter (HOSPITAL_COMMUNITY)

## 2024-10-25 DIAGNOSIS — Z952 Presence of prosthetic heart valve: Secondary | ICD-10-CM

## 2024-10-25 MED ORDER — METOPROLOL TARTRATE 50 MG PO TABS: 50.0000 mg | ORAL_TABLET | Freq: Two times a day (BID) | ORAL | 1 refills | Status: AC

## 2024-10-25 MED ORDER — ASPIRIN 81 MG PO TBEC: 81.0000 mg | DELAYED_RELEASE_TABLET | Freq: Every day | ORAL | 1 refills | Status: AC

## 2024-10-25 NOTE — Telephone Encounter (Signed)
 Refill sent

## 2024-10-25 NOTE — Telephone Encounter (Signed)
" °*  STAT* If patient is at the pharmacy, call can be transferred to refill team.   1. Which medications need to be refilled? (please list name of each medication and dose if known)   aspirin  EC 81 MG tablet    metoprolol  tartrate (LOPRESSOR ) 50 MG tablet    2. Would you like to learn more about the convenience, safety, & potential cost savings by using the [facility]?    No  3. Are you open to using the [facility] (Type [facility]. )   4. Which pharmacy/location (including street and city if local pharmacy) is medication to be sent to?    [id number] [place], [place] - [address]   5. Do they need a 30 day or 90 day supply? 90   Out of medication   "

## 2024-10-27 ENCOUNTER — Encounter (HOSPITAL_COMMUNITY)
Admission: RE | Admit: 2024-10-27 | Discharge: 2024-10-27 | Disposition: A | Source: Ambulatory Visit | Attending: Cardiology

## 2024-10-27 ENCOUNTER — Encounter (HOSPITAL_COMMUNITY)

## 2024-10-27 DIAGNOSIS — Z952 Presence of prosthetic heart valve: Secondary | ICD-10-CM | POA: Diagnosis not present

## 2024-10-29 ENCOUNTER — Encounter (HOSPITAL_COMMUNITY)

## 2024-11-01 ENCOUNTER — Encounter (HOSPITAL_COMMUNITY)

## 2024-11-01 ENCOUNTER — Encounter (HOSPITAL_COMMUNITY)
Admission: RE | Admit: 2024-11-01 | Discharge: 2024-11-01 | Disposition: A | Source: Ambulatory Visit | Attending: Cardiology

## 2024-11-01 DIAGNOSIS — Z952 Presence of prosthetic heart valve: Secondary | ICD-10-CM | POA: Diagnosis not present

## 2024-11-02 NOTE — Progress Notes (Signed)
 Cardiac Individual Treatment Plan  Patient Details  Name: Jerry Parrish MRN: [id number] Date of Birth: August 08, 1966 Referring Provider:   Flowsheet Row INTENSIVE CARDIAC REHAB ORIENT from 09/14/2024 in [facility]  Referring Provider Alm Clay, MD    Initial Encounter Date:  Flowsheet Row INTENSIVE CARDIAC REHAB ORIENT from 09/14/2024 in [facility]  Date 09/15/24    Visit Diagnosis: S/P AVR (aortic valve replacement)  Patient's Home Medications on Admission: Current Medications[1]  Past Medical History: Past Medical History:  Diagnosis Date   Carpal tunnel syndrome    Chest pain    normal coronaries 07/09/2024   Depression    Heart murmur    07/02/2024 TTE: severe AS, mild AI, mild-moderate MR   Hemorrhoids    HTN (hypertension)    Obesity    T wave inversion in EKG     Tobacco Use: Tobacco Use History[2]  Labs: Review Flowsheet       Latest Ref Rng & Units 07/18/2013 07/09/2024 07/22/2024 07/23/2024  Labs for ITP Cardiac and Pulmonary Rehab  Cholestrol 0 - 200 mg/dL 871  - - -  LDL (calc) 0 - 99 mg/dL 77  - - -  HDL-C >60 mg/dL 42  - - -  Trlycerides <150 mg/dL 45  - - -  Hemoglobin J8r 4.8 - 5.6 % 5.6  - 4.8  -  PH, Arterial 7.35 - 7.45 - 7.365  - 7.333  7.316  7.355  7.419  7.406  7.367  7.235  7.329  7.333   PCO2 arterial 32 - 48 mmHg - 45.1  - 40.3  42.9  38.6  35.6  38.3  41.3  57.3  49.7  49.7   Bicarbonate 20.0 - 28.0 mmol/L - 25.8  25.9  - 21.3  21.8  21.8  23.0  24.1  23.7  24.3  25.3  26.2  26.4   TCO2 22 - 32 mmol/L - 27  27  - 22  23  23  24  24  25  25  23  25  26  25  27  28  24  28  26    Acid-base deficit 0.0 - 2.0 mmol/L - 1.0  - 4.0  4.0  4.0  1.0  1.0  2.0  4.0  1.0   O2 Saturation % - 95  62  - 94  94  95  100  100  100  100  90  100  100     Details       Multiple values from one day are sorted in reverse-chronological order          Capillary Blood Glucose: Lab Results  Component Value Date   GLUCAP 91 07/28/2024   GLUCAP 88 07/28/2024   GLUCAP 126 (H) 07/27/2024   GLUCAP 112 (H) 07/27/2024   GLUCAP 121 (H) 07/27/2024     Exercise Target Goals: Exercise Program Goal: Individual exercise prescription set using results from initial 6 min walk test and THRR while considering  patients activity barriers and safety.   Exercise Prescription Goal: Initial exercise prescription builds to 30-45 minutes a day of aerobic activity, 2-3 days per week.  Home exercise guidelines will be given to patient during program as part of exercise prescription that the participant will acknowledge.  Activity Barriers & Risk Stratification:  Activity Barriers & Cardiac Risk Stratification - 09/14/24 1349  Activity Barriers & Cardiac Risk Stratification   Activity Barriers Balance Concerns;Deconditioning;Back Problems;Muscular Weakness    Cardiac Risk Stratification High          6 Minute Walk:  6 Minute Walk     Row Name 09/14/24 1149         6 Minute Walk   Phase Initial     Distance 1320 feet     Walk Time 6 minutes     # of Rest Breaks 0     MPH 2.5     METS 2.8     RPE 11     Perceived Dyspnea  0     VO2 Peak 9.71     Symptoms No     Resting HR 77 bpm     Resting BP 118/58     Resting Oxygen Saturation  95 %     Exercise Oxygen Saturation  during 6 min walk 95 %     Max Ex. HR 84 bpm     Max Ex. BP 124/78     2 Minute Post BP 110/72        Oxygen Initial Assessment:   Oxygen Re-Evaluation:   Oxygen Discharge (Final Oxygen Re-Evaluation):   Initial Exercise Prescription:  Initial Exercise Prescription - 09/14/24 1300       Date of Initial Exercise RX and Referring Provider   Date 09/15/24    Referring Provider Alm Clay, MD    Expected Discharge Date 12/08/24      Recumbant Bike   Level 1    RPM 60    Watts 25    Minutes 15    METs 2.8      T5 Nustep   Level 1     SPM 75    Minutes 15    METs 2.8      Prescription Details   Frequency (times per week) 3    Duration Progress to 30 minutes of continuous aerobic without signs/symptoms of physical distress      Intensity   THRR 40-80% of Max Heartrate 65-131    Ratings of Perceived Exertion 11-13    Perceived Dyspnea 0-4      Progression   Progression Continue progressive overload as per policy without signs/symptoms or physical distress.      Resistance Training   Training Prescription Yes    Weight 3 lbs    Reps 10-15          Perform Capillary Blood Glucose checks as needed.  Exercise Prescription Changes:   Exercise Prescription Changes     Row Name 09/20/24 1600 10/04/24 1600 10/20/24 0832         Response to Exercise   Blood Pressure (Admit) 124/68 120/70 138/70     Blood Pressure (Exercise) 145/78 130/68 --     Blood Pressure (Exit) 104/62 118/64 120/64     Heart Rate (Admit) 62 bpm 73 bpm 60 bpm     Heart Rate (Exercise) 81 bpm 113 bpm 79 bpm     Heart Rate (Exit) 68 bpm 67 bpm 64 bpm     Rating of Perceived Exertion (Exercise) 11 11 11      Symptoms None None None     Comments Pt's first day in the CRP2 program Reviewed METs Reviewed METs and goals     Duration Continue with 30 min of aerobic exercise without signs/symptoms of physical distress. Continue with 30 min of aerobic exercise without signs/symptoms of physical distress. Continue with 30 min of  aerobic exercise without signs/symptoms of physical distress.     Intensity THRR unchanged THRR unchanged THRR unchanged       Progression   Progression Continue to progress workloads to maintain intensity without signs/symptoms of physical distress. Continue to progress workloads to maintain intensity without signs/symptoms of physical distress. Continue to progress workloads to maintain intensity without signs/symptoms of physical distress.     Average METs 1.9 3 3.2       Resistance Training   Training Prescription  Yes Yes --     Weight 3 lbs 3 lbs 3 lbs     Reps 10-15 10-15 10-15     Time 5 Minutes 5 Minutes 5 Minutes       Interval Training   Interval Training No No No       Recumbant Bike   Level 1 1 3      RPM 54 84 86     Watts 10 26 52     Minutes 15 15 15      METs 1.9 2.3 3.2       NuStep   Level 1 1 2      SPM 69 121 109     Minutes 15 15 15      METs 1.9 3.8 3.2        Exercise Comments:   Exercise Comments     Row Name 09/20/24 1629 10/04/24 1644 10/20/24 0836       Exercise Comments Pt's first day in the CRP2 program. Pt exercise without complaints; tolerated session well. Reviewed METs with patient today. Pt is making good progress. Reviewed MET's and goals. Pt tolerated exercise well with an average MET level of 3.2. He is doing well and increasing WL's and MET's. He is having a hard time with weight loss but does have the resources he needs. He does feel an increase in strength and stamina        Exercise Goals and Review:   Exercise Goals     Row Name 09/14/24 1059             Exercise Goals   Increase Physical Activity Yes       Intervention Provide advice, education, support and counseling about physical activity/exercise needs.;Develop an individualized exercise prescription for aerobic and resistive training based on initial evaluation findings, risk stratification, comorbidities and participant's personal goals.       Expected Outcomes Short Term: Attend rehab on a regular basis to increase amount of physical activity.;Long Term: Add in home exercise to make exercise part of routine and to increase amount of physical activity.;Long Term: Exercising regularly at least 3-5 days a week.       Increase Strength and Stamina Yes       Intervention Provide advice, education, support and counseling about physical activity/exercise needs.;Develop an individualized exercise prescription for aerobic and resistive training based on initial evaluation findings, risk  stratification, comorbidities and participant's personal goals.       Expected Outcomes Short Term: Increase workloads from initial exercise prescription for resistance, speed, and METs.;Short Term: Perform resistance training exercises routinely during rehab and add in resistance training at home;Long Term: Improve cardiorespiratory fitness, muscular endurance and strength as measured by increased METs and functional capacity ( )       Able to understand and use rate of perceived exertion (RPE) scale Yes       Intervention Provide education and explanation on how to use RPE scale       Expected Outcomes Long  Term:  Able to use RPE to guide intensity level when exercising independently;Short Term: Able to use RPE daily in rehab to express subjective intensity level       Knowledge and understanding of Target Heart Rate Range (THRR) Yes       Intervention Provide education and explanation of THRR including how the numbers were predicted and where they are located for reference       Expected Outcomes Short Term: Able to state/look up THRR;Long Term: Able to use THRR to govern intensity when exercising independently;Short Term: Able to use daily as guideline for intensity in rehab       Understanding of Exercise Prescription Yes       Intervention Provide education, explanation, and written materials on patient's individual exercise prescription       Expected Outcomes Short Term: Able to explain program exercise prescription;Long Term: Able to explain home exercise prescription to exercise independently          Exercise Goals Re-Evaluation :  Exercise Goals Re-Evaluation     Row Name 09/20/24 1627 10/20/24 0834           Exercise Goal Re-Evaluation   Exercise Goals Review Increase Physical Activity;Increase Strength and Stamina;Able to understand and use rate of perceived exertion (RPE) scale;Knowledge and understanding of Target Heart Rate Range (THRR);Understanding of Exercise  Prescription Increase Physical Activity;Increase Strength and Stamina;Able to understand and use rate of perceived exertion (RPE) scale;Knowledge and understanding of Target Heart Rate Range (THRR);Understanding of Exercise Prescription      Comments Pt's first day in the CRP2 program. Pt understands the exercise Rx, RPE sclae and THRR. Reviewed MET's and goals. Pt tolerated exercise well with an average MET level of 3.2. He is doing well and increasing WL's and MET's. He is having a hard time with weight loss but does have the resources he needs. He does feel an increase in strength and stamina      Expected Outcomes Will continue to monitor the patient and progress workloads as tolerated. Will continue to monitor the patient and progress workloads as tolerated.         Discharge Exercise Prescription (Final Exercise Prescription Changes):  Exercise Prescription Changes - 10/20/24 0832       Response to Exercise   Blood Pressure (Admit) 138/70    Blood Pressure (Exit) 120/64    Heart Rate (Admit) 60 bpm    Heart Rate (Exercise) 79 bpm    Heart Rate (Exit) 64 bpm    Rating of Perceived Exertion (Exercise) 11    Symptoms None    Comments Reviewed METs and goals    Duration Continue with 30 min of aerobic exercise without signs/symptoms of physical distress.    Intensity THRR unchanged      Progression   Progression Continue to progress workloads to maintain intensity without signs/symptoms of physical distress.    Average METs 3.2      Resistance Training   Weight 3 lbs    Reps 10-15    Time 5 Minutes      Interval Training   Interval Training No      Recumbant Bike   Level 3    RPM 86    Watts 52    Minutes 15    METs 3.2      NuStep   Level 2    SPM 109    Minutes 15    METs 3.2          Nutrition:  Target Goals: Understanding of nutrition guidelines, daily intake of sodium 1500mg , cholesterol 200mg , calories 30% from fat and 7% or less from saturated fats,  daily to have 5 or more servings of fruits and vegetables.  Biometrics:   Post Biometrics - 09/14/24 1110        Post  Biometrics   Waist Circumference 53.5 inches    Hip Circumference 52 inches    Waist to Hip Ratio 1.03 %    Triceps Skinfold 23 mm    % Body Fat 39.1 %    Grip Strength 23 kg    Flexibility 0 in    Single Leg Stand 6.93 seconds          Nutrition Therapy Plan and Nutrition Goals:  Nutrition Therapy & Goals - 09/20/24 1141       Nutrition Therapy   Diet Heart Healthy    Drug/Food Interactions Coumadin /Vit K      Personal Nutrition Goals   Nutrition Goal Patient to identify strategies for reducing cardiovascular risk by attending the [facility] education and nutrition series weekly.    Personal Goal #2 Patient to improve diet quality by using the plate method as a guide for meal planning to include lean protein/plant protein, fruits, vegetables, whole grains, nonfat dairy as part of a well-balanced diet.    Personal Goal #3 Patient to identify strategies for weight loss with goal of 0.5-2 # per week of weight loss.    Comments Patient with history of HTN s/p aortic valve replacement on 07/23/24. Pt states diet largely consists of processed foods and take-out which contains significant sodium as well as added fat and sugar. Reports relying on products such as Lunchables for work due to convenience. RD provided suggestions for easy-to-prep meals including alternatives to high sodium foods such as lightly seasoned chicken breast, no salt added tuna. Current BMI in obese category; however 0.8 lb wt loss noted over past week. Patient will benefit from participation in intensive cardiac rehab for nutrition education, exercise, and lifestyle modification.      Intervention Plan   Intervention Prescribe, educate and counsel regarding individualized specific dietary modifications aiming towards targeted core components such as weight, hypertension, lipid management, diabetes,  heart failure and other comorbidities.    Expected Outcomes Short Term Goal: Understand basic principles of dietary content, such as calories, fat, sodium, cholesterol and nutrients.;Long Term Goal: Adherence to prescribed nutrition plan.          Nutrition Assessments:  MEDIFICTS Score Key: >=70 Need to make dietary changes  40-70 Heart Healthy Diet <= 40 Therapeutic Level Cholesterol Diet   Flowsheet Row INTENSIVE CARDIAC REHAB from 09/20/2024 in [facility]  Picture Your Plate Total Score on Admission 38   Picture Your Plate Scores: <59 Unhealthy dietary pattern with much room for improvement. 41-50 Dietary pattern unlikely to meet recommendations for good health and room for improvement. 51-60 More healthful dietary pattern, with some room for improvement.  >60 Healthy dietary pattern, although there may be some specific behaviors that could be improved.    Nutrition Goals Re-Evaluation:  Nutrition Goals Re-Evaluation     Row Name 10/20/24 1101             Goals   Current Weight 282 lb 3 oz (128 kg)       Nutrition Goal Patient to identify strategies for reducing cardiovascular risk by attending the [facility] education and nutrition series weekly.  Expected Outcome Goals in action. Patient with history of HTN s/p aortic valve replacement on 07/23/24. Non-significant weight gain of 1.5 kg (1.2%) over past month. Pt reports restricting sugary beverages and not adding salt to foods. However continues to rely heavily on restaurant foods due to convenience. RD reviewed low sodium/salt-free convenience food options. Pt participating in education sessions, including cooking school. Patient will benefit from participation in intensive cardiac rehab for nutrition education, exercise, and lifestyle modification.         Personal Goal #2 Re-Evaluation   Personal Goal #2 Patient to improve diet quality by using the plate  method as a guide for meal planning to include lean protein/plant protein, fruits, vegetables, whole grains, nonfat dairy as part of a well-balanced diet.         Personal Goal #3 Re-Evaluation   Personal Goal #3 Patient to identify strategies for weight loss with goal of 0.5-2 # per week of weight loss.          Nutrition Goals Re-Evaluation:  Nutrition Goals Re-Evaluation     Row Name 10/20/24 1101             Goals   Current Weight 282 lb 3 oz (128 kg)       Nutrition Goal Patient to identify strategies for reducing cardiovascular risk by attending the [facility] education and nutrition series weekly.       Expected Outcome Goals in action. Patient with history of HTN s/p aortic valve replacement on 07/23/24. Non-significant weight gain of 1.5 kg (1.2%) over past month. Pt reports restricting sugary beverages and not adding salt to foods. However continues to rely heavily on restaurant foods due to convenience. RD reviewed low sodium/salt-free convenience food options. Pt participating in education sessions, including cooking school. Patient will benefit from participation in intensive cardiac rehab for nutrition education, exercise, and lifestyle modification.         Personal Goal #2 Re-Evaluation   Personal Goal #2 Patient to improve diet quality by using the plate method as a guide for meal planning to include lean protein/plant protein, fruits, vegetables, whole grains, nonfat dairy as part of a well-balanced diet.         Personal Goal #3 Re-Evaluation   Personal Goal #3 Patient to identify strategies for weight loss with goal of 0.5-2 # per week of weight loss.          Nutrition Goals Discharge (Final Nutrition Goals Re-Evaluation):  Nutrition Goals Re-Evaluation - 10/20/24 1101       Goals   Current Weight 282 lb 3 oz (128 kg)    Nutrition Goal Patient to identify strategies for reducing cardiovascular risk by attending the [facility] education and nutrition series weekly.     Expected Outcome Goals in action. Patient with history of HTN s/p aortic valve replacement on 07/23/24. Non-significant weight gain of 1.5 kg (1.2%) over past month. Pt reports restricting sugary beverages and not adding salt to foods. However continues to rely heavily on restaurant foods due to convenience. RD reviewed low sodium/salt-free convenience food options. Pt participating in education sessions, including cooking school. Patient will benefit from participation in intensive cardiac rehab for nutrition education, exercise, and lifestyle modification.      Personal Goal #2 Re-Evaluation   Personal Goal #2 Patient to improve diet quality by using the plate method as a guide for meal planning to include lean protein/plant protein, fruits, vegetables, whole grains, nonfat dairy as part of a well-balanced diet.  Personal Goal #3 Re-Evaluation   Personal Goal #3 Patient to identify strategies for weight loss with goal of 0.5-2 # per week of weight loss.          Psychosocial: Target Goals: Acknowledge presence or absence of significant depression and/or stress, maximize coping skills, provide positive support system. Participant is able to verbalize types and ability to use techniques and skills needed for reducing stress and depression.  Initial Review & Psychosocial Screening:  Initial Psych Review & Screening - 09/14/24 1356       Initial Review   Current issues with History of Depression;Current Psychotropic Meds;Current Sleep Concerns      Family Dynamics   Good Support System? Yes   Pt has spouse and 2 adult sons     Barriers   Psychosocial barriers to participate in program The patient should benefit from training in stress management and relaxation.      Screening Interventions   Interventions Encouraged to exercise    Expected Outcomes Short Term goal: Utilizing psychosocial counselor, staff and physician to assist with identification of specific Stressors or current  issues interfering with healing process. Setting desired goal for each stressor or current issue identified.;Long Term Goal: Stressors or current issues are controlled or eliminated.;Short Term goal: Identification and review with participant of any Quality of Life or Depression concerns found by scoring the questionnaire.;Long Term goal: The participant improves quality of Life and PHQ9 Scores as seen by post scores and/or verbalization of changes          Quality of Life Scores:  Quality of Life - 09/14/24 1100       Quality of Life   Select Quality of Life      Quality of Life Scores   Health/Function Pre 24.67 %    Socioeconomic Pre 27.71 %    Psych/Spiritual Pre 27.86 %    Family Pre 26.4 %    GLOBAL Pre 26.21 %         Scores of 19 and below usually indicate a poorer quality of life in these areas.  A difference of  2-3 points is a clinically meaningful difference.  A difference of 2-3 points in the total score of the Quality of Life Index has been associated with significant improvement in overall quality of life, self-image, physical symptoms, and general health in studies assessing change in quality of life.  PHQ-9: Review Flowsheet       09/14/2024  Depression screen PHQ 2/9  Decreased Interest 0  Down, Depressed, Hopeless 0  PHQ - 2 Score 0  Altered sleeping 1  Tired, decreased energy 0  Change in appetite 0  Feeling bad or failure about yourself  0  Trouble concentrating 0  Moving slowly or fidgety/restless 0  Suicidal thoughts 0  PHQ-9 Score 1  Difficult doing work/chores Not difficult at all   Interpretation of Total Score  Total Score Depression Severity:  1-4 = Minimal depression, 5-9 = Mild depression, 10-14 = Moderate depression, 15-19 = Moderately severe depression, 20-27 = Severe depression   Psychosocial Evaluation and Intervention:   Psychosocial Re-Evaluation:  Psychosocial Re-Evaluation     Row Name 09/28/24 1416 10/05/24 1235 10/27/24  1631         Psychosocial Re-Evaluation   Current issues with History of Depression;Current Psychotropic Meds;Current Sleep Concerns History of Depression;Current Psychotropic Meds;Current Sleep Concerns History of Depression;Current Psychotropic Meds;Current Sleep Concerns     Comments Jerry Parrish started cardiac rehab on 09/20/24. Jerry Parrish has not voiced  any increased concerns or stressors during exercise at cardiac rehab Reviewed PHQ9. Jerry Parrish says his sleep is improving. Jerry Parrish is going back to work on December 15 then will be out for 2 weeks due to the holidays. Jerry Parrish says that participating in cardiac rehab has been helpful for him. Jerry Parrish has not voiced any additional psychosocial concerns or stressors during exercise at cardiac rehab     Expected Outcomes -- -- Jerry Parrish will experience controlled or decreased psychosocial concerns or stressors by completion of cardiac rehab     Interventions Stress management education;Relaxation education;Encouraged to attend Cardiac Rehabilitation for the exercise Stress management education;Relaxation education;Encouraged to attend Cardiac Rehabilitation for the exercise Stress management education;Relaxation education;Encouraged to attend Cardiac Rehabilitation for the exercise     Continue Psychosocial Services  Follow up required by staff No Follow up required No Follow up required        Psychosocial Discharge (Final Psychosocial Re-Evaluation):  Psychosocial Re-Evaluation - 10/27/24 1631       Psychosocial Re-Evaluation   Current issues with History of Depression;Current Psychotropic Meds;Current Sleep Concerns    Comments Jerry Parrish has not voiced any additional psychosocial concerns or stressors during exercise at cardiac rehab    Expected Outcomes Jerry Parrish will experience controlled or decreased psychosocial concerns or stressors by completion of cardiac rehab    Interventions Stress management education;Relaxation education;Encouraged to attend Cardiac Rehabilitation for the  exercise    Continue Psychosocial Services  No Follow up required          Vocational Rehabilitation: Provide vocational rehab assistance to qualifying candidates.   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 To return to work 10/18/24         Education: Education Goals: Education classes will be provided on a weekly basis, covering required topics. Participant will state understanding/return demonstration of topics presented.    Education     Row Name 09/20/24 0800     Education   Cardiac Education Topics [facility]   Psychologist, Forensic Exercise Education   Exercise Education Move It!   Instruction Review Code 1- Verbalizes Understanding   Class Start Time 0815   Class Stop Time 0854   Class Time Calculation (min) 39 min    Row Name 09/22/24 0900     Education   Cardiac Education Topics [employer]   Secondary School Teacher School   Educator Dietitian   Weekly Topic Efficiency Cooking - Meals in a Snap   Instruction Review Code 1- Verbalizes Understanding   Class Start Time 0815   Class Stop Time 0904   Class Time Calculation (min) 49 min    Row Name 09/24/24 0800     Education   Cardiac Education Topics Pritikin   Select Workshops     Workshops   Educator Exercise Physiologist   Select Exercise   Exercise Workshop Exercise Basics: Building Your Action Plan   Nutrition Workshop --   Instruction Review Code 1- Verbalizes Understanding   Class Start Time 0815   Class Stop Time 0851   Class Time Calculation (min) 36 min    Row Name 09/27/24 0800     Education   Cardiac Education Topics Pritikin   Select  Workshops     Workshops   Geophysical Data Processor Nutrition   Nutrition Workshop  Targeting Your Nutrition Priorities   Instruction Review Code 1- Verbalizes Understanding   Class Start Time 0815   Class Stop Time 0901   Class Time Calculation (min) 46 min    Row Name 09/29/24 0900     Education   Cardiac Education Topics Pritikin   Secondary School Teacher School   Educator Dietitian   Weekly Topic One-Pot Wonders   Instruction Review Code 1- Verbalizes Understanding   Class Start Time [phone]   Class Stop Time 0858   Class Time Calculation (min) 44 min    Row Name 10/04/24 0800     Education   Cardiac Education Topics Pritikin   Select Workshops     Workshops   Educator Exercise Physiologist   Select Psychosocial   Psychosocial Workshop Focused Goals, Sustainable Changes   Instruction Review Code 1- Verbalizes Understanding   Class Start Time 0815   Class Stop Time 0850   Class Time Calculation (min) 35 min    Row Name 10/06/24 0900     Education   Cardiac Education Topics Pritikin   Orthoptist   Educator Dietitian   Weekly Topic Comforting Weekend Breakfasts   Instruction Review Code 1- Verbalizes Understanding   Class Start Time 0815   Class Stop Time 0857   Class Time Calculation (min) 42 min    Row Name 10/08/24 0800     Education   Cardiac Education Topics Pritikin   Select Core Videos     Core Videos   Educator Dietitian   Select Nutrition   Nutrition Dining Out - Part 1   Instruction Review Code 1- Verbalizes Understanding   Class Start Time [phone]   Class Stop Time 0853   Class Time Calculation (min) 37 min    Row Name 10/11/24 0800     Education   Cardiac Education Topics Pritikin   Select Core Videos     Core Videos   Educator Exercise Physiologist   Select Exercise Education   Exercise Education Biomechanial Limitations   Instruction Review Code 1- Verbalizes Understanding   Class Start Time 0815   Class Stop Time 0855   Class Time Calculation (min) 40 min    Row Name  10/13/24 0800     Education   Cardiac Education Topics Pritikin   Secondary School Teacher School   Educator Dietitian   Weekly Topic Fast Evening Meals   Instruction Review Code 1- Verbalizes Understanding   Class Start Time 0815   Class Stop Time 0902   Class Time Calculation (min) 47 min    Row Name 10/15/24 0800     Education   Cardiac Education Topics Pritikin   Select Core Videos     Core Videos   Educator Dietitian   Select Nutrition   Nutrition Vitamins and Minerals   Instruction Review Code 1- Verbalizes Understanding   Class Start Time 0815   Class Stop Time 0856   Class Time Calculation (min) 41 min    Row Name 10/18/24 0700     Education   Cardiac Education Topics Pritikin   Psychologist, Forensic Exercise Education   Exercise Education Improving Performance   Instruction Review Code 1- Verbalizes Understanding   Class  Start Time 0820   Class Stop Time 0855   Class Time Calculation (min) 35 min    Row Name 10/20/24 1000     Education   Cardiac Education Topics Pritikin   Customer Service Manager   Weekly Topic International Cuisine- Spotlight on the [facility] Zones   Instruction Review Code 1- Verbalizes Understanding   Class Start Time 0815   Class Stop Time 0903   Class Time Calculation (min) 48 min    Row Name 10/22/24 0800     Education   Cardiac Education Topics Pritikin   Select Workshops     Core Videos   Educator --   Select --   Nutrition --   Instruction Review Code --     Workshops   Geophysical Data Processor Nutrition   Nutrition Workshop Fueling a Forensic Psychologist   Instruction Review Code 1- Verbalizes Understanding   Class Start Time 0815   Class Stop Time 0901   Class Time Calculation (min) 46 min    Row Name 10/25/24 0700     Education   Cardiac Education Topics Pritikin   Select Workshops     Workshops    Educator Exercise Physiologist   Select Psychosocial   Psychosocial Workshop Healthy Sleep for a Healthy Heart   Instruction Review Code 1- Verbalizes Understanding   Class Start Time 0815   Class Stop Time 0856   Class Time Calculation (min) 41 min    Row Name 10/27/24 0800     Education   Cardiac Education Topics Pritikin   Secondary School Teacher School   Educator Dietitian   Weekly Topic Simple Sides and Sauces   Instruction Review Code 1- Verbalizes Understanding   Class Start Time 0815   Class Stop Time 0856   Class Time Calculation (min) 41 min    Row Name 11/01/24 0700     Education   Cardiac Education Topics Pritikin   Select Core Videos     Core Videos   Educator Nurse   Select Psychosocial   Psychosocial How Our Thoughts Can Heal Our Hearts   Instruction Review Code 1- Verbalizes Understanding   Class Start Time [phone]   Class Stop Time 0852   Class Time Calculation (min) 33 min      Core Videos: Exercise    Move It!  Clinical staff conducted group or individual video education with verbal and written material and guidebook.  Patient learns the recommended Pritikin exercise program. Exercise with the goal of living a long, healthy life. Some of the health benefits of exercise include controlled diabetes, healthier blood pressure levels, improved cholesterol levels, improved heart and lung capacity, improved sleep, and better body composition. Everyone should speak with their doctor before starting or changing an exercise routine.  Biomechanical Limitations Clinical staff conducted group or individual video education with verbal and written material and guidebook.  Patient learns how biomechanical limitations can impact exercise and how we can mitigate and possibly overcome limitations to have an impactful and balanced exercise routine.  Body Composition Clinical staff conducted group or individual video education with verbal and written material  and guidebook.  Patient learns that body composition (ratio of muscle mass to fat mass) is a key component to assessing overall fitness, rather than body weight alone. Increased fat mass, especially visceral belly fat, can put us  at increased risk for metabolic syndrome, type 2 diabetes, heart  disease, and even death. It is recommended to combine diet and exercise (cardiovascular and resistance training) to improve your body composition. Seek guidance from your physician and exercise physiologist before implementing an exercise routine.  Exercise Action Plan Clinical staff conducted group or individual video education with verbal and written material and guidebook.  Patient learns the recommended strategies to achieve and enjoy long-term exercise adherence, including variety, self-motivation, self-efficacy, and positive decision making. Benefits of exercise include fitness, good health, weight management, more energy, better sleep, less stress, and overall well-being.  Medical   Heart Disease Risk Reduction Clinical staff conducted group or individual video education with verbal and written material and guidebook.  Patient learns our heart is our most vital organ as it circulates oxygen, nutrients, white blood cells, and hormones throughout the entire body, and carries waste away. Data supports a plant-based eating plan like the Pritikin Program for its effectiveness in slowing progression of and reversing heart disease. The video provides a number of recommendations to address heart disease.   Metabolic Syndrome and Belly Fat  Clinical staff conducted group or individual video education with verbal and written material and guidebook.  Patient learns what metabolic syndrome is, how it leads to heart disease, and how one can reverse it and keep it from coming back. You have metabolic syndrome if you have 3 of the following 5 criteria: abdominal obesity, high blood pressure, high triglycerides, low HDL  cholesterol, and high blood sugar.  Hypertension and Heart Disease Clinical staff conducted group or individual video education with verbal and written material and guidebook.  Patient learns that high blood pressure, or hypertension, is very common in the [place] . Hypertension is largely due to excessive salt intake, but other important risk factors include being overweight, physical inactivity, drinking too much alcohol, smoking, and not eating enough potassium from fruits and vegetables. High blood pressure is a leading risk factor for heart attack, stroke, congestive heart failure, dementia, kidney failure, and premature death. Long-term effects of excessive salt intake include stiffening of the arteries and thickening of heart muscle and organ damage. Recommendations include ways to reduce hypertension and the risk of heart disease.  Diseases of Our Time - Focusing on Diabetes Clinical staff conducted group or individual video education with verbal and written material and guidebook.  Patient learns why the best way to stop diseases of our time is prevention, through food and other lifestyle changes. Medicine (such as prescription pills and surgeries) is often only a Band-Aid on the problem, not a long-term solution. Most common diseases of our time include obesity, type 2 diabetes, hypertension, heart disease, and cancer. The Pritikin Program is recommended and has been proven to help reduce, reverse, and/or prevent the damaging effects of metabolic syndrome.  Nutrition   Overview of the Pritikin Eating Plan  Clinical staff conducted group or individual video education with verbal and written material and guidebook.  Patient learns about the Pritikin Eating Plan for disease risk reduction. The Pritikin Eating Plan emphasizes a wide variety of unrefined, minimally-processed carbohydrates, like fruits, vegetables, whole grains, and legumes. Go, Caution, and Stop food choices are explained.  Plant-based and lean animal proteins are emphasized. Rationale provided for low sodium intake for blood pressure control, low added sugars for blood sugar stabilization, and low added fats and oils for coronary artery disease risk reduction and weight management.  Calorie Density  Clinical staff conducted group or individual video education with verbal and written material and guidebook.  Patient learns about  calorie density and how it impacts the Pritikin Eating Plan. Knowing the characteristics of the food you choose will help you decide whether those foods will lead to weight gain or weight loss, and whether you want to consume more or less of them. Weight loss is usually a side effect of the Pritikin Eating Plan because of its focus on low calorie-dense foods.  Label Reading  Clinical staff conducted group or individual video education with verbal and written material and guidebook.  Patient learns about the Pritikin recommended label reading guidelines and corresponding recommendations regarding calorie density, added sugars, sodium content, and whole grains.  Dining Out - Part 1  Clinical staff conducted group or individual video education with verbal and written material and guidebook.  Patient learns that restaurant meals can be sabotaging because they can be so high in calories, fat, sodium, and/or sugar. Patient learns recommended strategies on how to positively address this and avoid unhealthy pitfalls.  Facts on Fats  Clinical staff conducted group or individual video education with verbal and written material and guidebook.  Patient learns that lifestyle modifications can be just as effective, if not more so, as many medications for lowering your risk of heart disease. A Pritikin lifestyle can help to reduce your risk of inflammation and atherosclerosis (cholesterol build-up, or plaque, in the artery walls). Lifestyle interventions such as dietary choices and physical activity address  the cause of atherosclerosis. A review of the types of fats and their impact on blood cholesterol levels, along with dietary recommendations to reduce fat intake is also included.  Nutrition Action Plan  Clinical staff conducted group or individual video education with verbal and written material and guidebook.  Patient learns how to incorporate Pritikin recommendations into their lifestyle. Recommendations include planning and keeping personal health goals in mind as an important part of their success.  Healthy Mind-Set    Healthy Minds, Bodies, Hearts  Clinical staff conducted group or individual video education with verbal and written material and guidebook.  Patient learns how to identify when they are stressed. Video will discuss the impact of that stress, as well as the many benefits of stress management. Patient will also be introduced to stress management techniques. The way we think, act, and feel has an impact on our hearts.  How Our Thoughts Can Heal Our Hearts  Clinical staff conducted group or individual video education with verbal and written material and guidebook.  Patient learns that negative thoughts can cause depression and anxiety. This can result in negative lifestyle behavior and serious health problems. Cognitive behavioral therapy is an effective method to help control our thoughts in order to change and improve our emotional outlook.  Additional Videos:  Exercise    Improving Performance  Clinical staff conducted group or individual video education with verbal and written material and guidebook.  Patient learns to use a non-linear approach by alternating intensity levels and lengths of time spent exercising to help burn more calories and lose more body fat. Cardiovascular exercise helps improve heart health, metabolism, hormonal balance, blood sugar control, and recovery from fatigue. Resistance training improves strength, endurance, balance, coordination, reaction time,  metabolism, and muscle mass. Flexibility exercise improves circulation, posture, and balance. Seek guidance from your physician and exercise physiologist before implementing an exercise routine and learn your capabilities and proper form for all exercise.  Introduction to Yoga  Clinical staff conducted group or individual video education with verbal and written material and guidebook.  Patient learns about yoga, a discipline  of the coming together of mind, breath, and body. The benefits of yoga include improved flexibility, improved range of motion, better posture and core strength, increased lung function, weight loss, and positive self-image. Yogas heart health benefits include lowered blood pressure, healthier heart rate, decreased cholesterol and triglyceride levels, improved immune function, and reduced stress. Seek guidance from your physician and exercise physiologist before implementing an exercise routine and learn your capabilities and proper form for all exercise.  Medical   Aging: Enhancing Your Quality of Life  Clinical staff conducted group or individual video education with verbal and written material and guidebook.  Patient learns key strategies and recommendations to stay in good physical health and enhance quality of life, such as prevention strategies, having an advocate, securing a Health Care Proxy and Power of Attorney, and keeping a list of medications and system for tracking them. It also discusses how to avoid risk for bone loss.  Biology of Weight Control  Clinical staff conducted group or individual video education with verbal and written material and guidebook.  Patient learns that weight gain occurs because we consume more calories than we burn (eating more, moving less). Even if your body weight is normal, you may have higher ratios of fat compared to muscle mass. Too much body fat puts you at increased risk for cardiovascular disease, heart attack, stroke, type 2  diabetes, and obesity-related cancers. In addition to exercise, following the Pritikin Eating Plan can help reduce your risk.  Decoding Lab Results  Clinical staff conducted group or individual video education with verbal and written material and guidebook.  Patient learns that lab test reflects one measurement whose values change over time and are influenced by many factors, including medication, stress, sleep, exercise, food, hydration, pre-existing medical conditions, and more. It is recommended to use the knowledge from this video to become more involved with your lab results and evaluate your numbers to speak with your doctor.   Diseases of Our Time - Overview  Clinical staff conducted group or individual video education with verbal and written material and guidebook.  Patient learns that according to the CDC, 50% to 70% of chronic diseases (such as obesity, type 2 diabetes, elevated lipids, hypertension, and heart disease) are avoidable through lifestyle improvements including healthier food choices, listening to satiety cues, and increased physical activity.  Sleep Disorders Clinical staff conducted group or individual video education with verbal and written material and guidebook.  Patient learns how good quality and duration of sleep are important to overall health and well-being. Patient also learns about sleep disorders and how they impact health along with recommendations to address them, including discussing with a physician.  Nutrition  Dining Out - Part 2 Clinical staff conducted group or individual video education with verbal and written material and guidebook.  Patient learns how to plan ahead and communicate in order to maximize their dining experience in a healthy and nutritious manner. Included are recommended food choices based on the type of restaurant the patient is visiting.   Fueling a Banker conducted group or individual video education with verbal  and written material and guidebook.  There is a strong connection between our food choices and our health. Diseases like obesity and type 2 diabetes are very prevalent and are in large-part due to lifestyle choices. The Pritikin Eating Plan provides plenty of food and hunger-curbing satisfaction. It is easy to follow, affordable, and helps reduce health risks.  Menu Workshop  Clinical staff conducted group  or individual video education with verbal and written material and guidebook.  Patient learns that restaurant meals can sabotage health goals because they are often packed with calories, fat, sodium, and sugar. Recommendations include strategies to plan ahead and to communicate with the manager, chef, or server to help order a healthier meal.  Planning Your Eating Strategy  Clinical staff conducted group or individual video education with verbal and written material and guidebook.  Patient learns about the Pritikin Eating Plan and its benefit of reducing the risk of disease. The Pritikin Eating Plan does not focus on calories. Instead, it emphasizes high-quality, nutrient-rich foods. By knowing the characteristics of the foods, we choose, we can determine their calorie density and make informed decisions.  Targeting Your Nutrition Priorities  Clinical staff conducted group or individual video education with verbal and written material and guidebook.  Patient learns that lifestyle habits have a tremendous impact on disease risk and progression. This video provides eating and physical activity recommendations based on your personal health goals, such as reducing LDL cholesterol, losing weight, preventing or controlling type 2 diabetes, and reducing high blood pressure.  Vitamins and Minerals  Clinical staff conducted group or individual video education with verbal and written material and guidebook.  Patient learns different ways to obtain key vitamins and minerals, including through a recommended  healthy diet. It is important to discuss all supplements you take with your doctor.   Healthy Mind-Set    Smoking Cessation  Clinical staff conducted group or individual video education with verbal and written material and guidebook.  Patient learns that cigarette smoking and tobacco addiction pose a serious health risk which affects millions of people. Stopping smoking will significantly reduce the risk of heart disease, lung disease, and many forms of cancer. Recommended strategies for quitting are covered, including working with your doctor to develop a successful plan.  Culinary   Becoming a Set Designer conducted group or individual video education with verbal and written material and guidebook.  Patient learns that cooking at home can be healthy, cost-effective, quick, and puts them in control. Keys to cooking healthy recipes will include looking at your recipe, assessing your equipment needs, planning ahead, making it simple, choosing cost-effective seasonal ingredients, and limiting the use of added fats, salts, and sugars.  Cooking - Breakfast and Snacks  Clinical staff conducted group or individual video education with verbal and written material and guidebook.  Patient learns how important breakfast is to satiety and nutrition through the entire day. Recommendations include key foods to eat during breakfast to help stabilize blood sugar levels and to prevent overeating at meals later in the day. Planning ahead is also a key component.  Cooking - Educational Psychologist conducted group or individual video education with verbal and written material and guidebook.  Patient learns eating strategies to improve overall health, including an approach to cook more at home. Recommendations include thinking of animal protein as a side on your plate rather than center stage and focusing instead on lower calorie dense options like vegetables, fruits, whole grains, and  plant-based proteins, such as beans. Making sauces in large quantities to freeze for later and leaving the skin on your vegetables are also recommended to maximize your experience.  Cooking - Healthy Salads and Dressing Clinical staff conducted group or individual video education with verbal and written material and guidebook.  Patient learns that vegetables, fruits, whole grains, and legumes are the foundations of the Pritikin Eating  Plan. Recommendations include how to incorporate each of these in flavorful and healthy salads, and how to create homemade salad dressings. Proper handling of ingredients is also covered. Cooking - Soups and [employer] - Soups and Desserts Clinical staff conducted group or individual video education with verbal and written material and guidebook.  Patient learns that Pritikin soups and desserts make for easy, nutritious, and delicious snacks and meal components that are low in sodium, fat, sugar, and calorie density, while high in vitamins, minerals, and filling fiber. Recommendations include simple and healthy ideas for soups and desserts.   Overview     The Pritikin Solution Program Overview Clinical staff conducted group or individual video education with verbal and written material and guidebook.  Patient learns that the results of the Pritikin Program have been documented in more than 100 articles published in peer-reviewed journals, and the benefits include reducing risk factors for (and, in some cases, even reversing) high cholesterol, high blood pressure, type 2 diabetes, obesity, and more! An overview of the three key pillars of the Pritikin Program will be covered: eating well, doing regular exercise, and having a healthy mind-set.  WORKSHOPS  Exercise: Exercise Basics: Building Your Action Plan Clinical staff led group instruction and group discussion with PowerPoint presentation and patient guidebook. To enhance the learning environment the use of  posters, models and videos may be added. At the conclusion of this workshop, patients will comprehend the difference between physical activity and exercise, as well as the benefits of incorporating both, into their routine. Patients will understand the FITT (Frequency, Intensity, Time, and Type) principle and how to use it to build an exercise action plan. In addition, safety concerns and other considerations for exercise and cardiac rehab will be addressed by the presenter. The purpose of this lesson is to promote a comprehensive and effective weekly exercise routine in order to improve patients overall level of fitness.   Managing Heart Disease: Your Path to a Healthier Heart Clinical staff led group instruction and group discussion with PowerPoint presentation and patient guidebook. To enhance the learning environment the use of posters, models and videos may be added.At the conclusion of this workshop, patients will understand the anatomy and physiology of the heart. Additionally, they will understand how Pritikins three pillars impact the risk factors, the progression, and the management of heart disease.  The purpose of this lesson is to provide a high-level overview of the heart, heart disease, and how the Pritikin lifestyle positively impacts risk factors.  Exercise Biomechanics Clinical staff led group instruction and group discussion with PowerPoint presentation and patient guidebook. To enhance the learning environment the use of posters, models and videos may be added. Patients will learn how the structural parts of their bodies function and how these functions impact their daily activities, movement, and exercise. Patients will learn how to promote a neutral spine, learn how to manage pain, and identify ways to improve their physical movement in order to promote healthy living. The purpose of this lesson is to expose patients to common physical limitations that impact physical  activity. Participants will learn practical ways to adapt and manage aches and pains, and to minimize their effect on regular exercise. Patients will learn how to maintain good posture while sitting, walking, and lifting.  Balance Training and Fall Prevention  Clinical staff led group instruction and group discussion with PowerPoint presentation and patient guidebook. To enhance the learning environment the use of posters, models and videos may be added. At  the conclusion of this workshop, patients will understand the importance of their sensorimotor skills (vision, proprioception, and the vestibular system) in maintaining their ability to balance as they age. Patients will apply a variety of balancing exercises that are appropriate for their current level of function. Patients will understand the common causes for poor balance, possible solutions to these problems, and ways to modify their physical environment in order to minimize their fall risk. The purpose of this lesson is to teach patients about the importance of maintaining balance as they age and ways to minimize their risk of falling.  WORKSHOPS   Nutrition:  Fueling a Ship Broker led group instruction and group discussion with PowerPoint presentation and patient guidebook. To enhance the learning environment the use of posters, models and videos may be added. Patients will review the foundational principles of the Pritikin Eating Plan and understand what constitutes a serving size in each of the food groups. Patients will also learn Pritikin-friendly foods that are better choices when away from home and review make-ahead meal and snack options. Calorie density will be reviewed and applied to three nutrition priorities: weight maintenance, weight loss, and weight gain. The purpose of this lesson is to reinforce (in a group setting) the key concepts around what patients are recommended to eat and how to apply these guidelines  when away from home by planning and selecting Pritikin-friendly options. Patients will understand how calorie density may be adjusted for different weight management goals.  Mindful Eating  Clinical staff led group instruction and group discussion with PowerPoint presentation and patient guidebook. To enhance the learning environment the use of posters, models and videos may be added. Patients will briefly review the concepts of the Pritikin Eating Plan and the importance of low-calorie dense foods. The concept of mindful eating will be introduced as well as the importance of paying attention to internal hunger signals. Triggers for non-hunger eating and techniques for dealing with triggers will be explored. The purpose of this lesson is to provide patients with the opportunity to review the basic principles of the Pritikin Eating Plan, discuss the value of eating mindfully and how to measure internal cues of hunger and fullness using the Hunger Scale. Patients will also discuss reasons for non-hunger eating and learn strategies to use for controlling emotional eating.  Targeting Your Nutrition Priorities Clinical staff led group instruction and group discussion with PowerPoint presentation and patient guidebook. To enhance the learning environment the use of posters, models and videos may be added. Patients will learn how to determine their genetic susceptibility to disease by reviewing their family history. Patients will gain insight into the importance of diet as part of an overall healthy lifestyle in mitigating the impact of genetics and other environmental insults. The purpose of this lesson is to provide patients with the opportunity to assess their personal nutrition priorities by looking at their family history, their own health history and current risk factors. Patients will also be able to discuss ways of prioritizing and modifying the Pritikin Eating Plan for their highest risk areas  Menu   Clinical staff led group instruction and group discussion with PowerPoint presentation and patient guidebook. To enhance the learning environment the use of posters, models and videos may be added. Using menus brought in from [employer], or printed from [employer], patients will apply the Pritikin dining out guidelines that were presented in the [employer] video. Patients will also be able to practice these guidelines in  a variety of provided scenarios. The purpose of this lesson is to provide patients with the opportunity to practice hands-on learning of the Pritikin Dining Out guidelines with actual menus and practice scenarios.  Label Reading Clinical staff led group instruction and group discussion with PowerPoint presentation and patient guidebook. To enhance the learning environment the use of posters, models and videos may be added. Patients will review and discuss the Pritikin label reading guidelines presented in Pritikins Label Reading Educational series video. Using fool labels brought in from local grocery stores and markets, patients will apply the label reading guidelines and determine if the packaged food meet the Pritikin guidelines. The purpose of this lesson is to provide patients with the opportunity to review, discuss, and practice hands-on learning of the Pritikin Label Reading guidelines with actual packaged food labels. Cooking School  Pritikins [employer] are designed to teach patients ways to prepare quick, simple, and affordable recipes at home. The importance of nutritions role in chronic disease risk reduction is reflected in its emphasis in the overall Pritikin program. By learning how to prepare essential core Pritikin Eating Plan recipes, patients will increase control over what they eat; be able to customize the flavor of foods without the use of added salt, sugar, or fat; and improve the quality of the food they consume. By  learning a set of core recipes which are easily assembled, quickly prepared, and affordable, patients are more likely to prepare more healthy foods at home. These workshops focus on convenient breakfasts, simple entres, side dishes, and desserts which can be prepared with minimal effort and are consistent with nutrition recommendations for cardiovascular risk reduction. Cooking [employer] are taught by a armed forces logistics/support/administrative officer (RD) who has been trained by the [employer]. The chef or RD has a clear understanding of the importance of minimizing - if not completely eliminating - added fat, sugar, and sodium in recipes. Throughout the series of Cooking School Workshop sessions, patients will learn about healthy ingredients and efficient methods of cooking to build confidence in their capability to prepare    Cooking School weekly topics:  Adding Flavor- Sodium-Free  Fast and Healthy Breakfasts  Powerhouse Plant-Based Proteins  Satisfying Salads and Dressings  Simple Sides and Sauces  International Cuisine-Spotlight on the [employer] Zones  Delicious Desserts  Savory Soups  [employer] - Meals in a Astronomer Appetizers and Snacks  Comforting Weekend Breakfasts  One-Pot Wonders   Fast Evening Meals  Landscape Architect Your Pritikin Plate  WORKSHOPS   Healthy Mindset (Psychosocial):  Focused Goals, Sustainable Changes Clinical staff led group instruction and group discussion with PowerPoint presentation and patient guidebook. To enhance the learning environment the use of posters, models and videos may be added. Patients will be able to apply effective goal setting strategies to establish at least one personal goal, and then take consistent, meaningful action toward that goal. They will learn to identify common barriers to achieving personal goals and develop strategies to overcome them. Patients will also gain an understanding of how our mind-set can  impact our ability to achieve goals and the importance of cultivating a positive and growth-oriented mind-set. The purpose of this lesson is to provide patients with a deeper understanding of how to set and achieve personal goals, as well as the tools and strategies needed to overcome common obstacles which may arise along the way.  From Head to Heart: The Power of a Psychologist, Occupational  led group instruction and group discussion with PowerPoint presentation and patient guidebook. To enhance the learning environment the use of posters, models and videos may be added. Patients will be able to recognize and describe the impact of emotions and mood on physical health. They will discover the importance of self-care and explore self-care practices which may work for them. Patients will also learn how to utilize the 4 Cs to cultivate a healthier outlook and better manage stress and challenges. The purpose of this lesson is to demonstrate to patients how a healthy outlook is an essential part of maintaining good health, especially as they continue their cardiac rehab journey.  Healthy Sleep for a Healthy Heart Clinical staff led group instruction and group discussion with PowerPoint presentation and patient guidebook. To enhance the learning environment the use of posters, models and videos may be added. At the conclusion of this workshop, patients will be able to demonstrate knowledge of the importance of sleep to overall health, well-being, and quality of life. They will understand the symptoms of, and treatments for, common sleep disorders. Patients will also be able to identify daytime and nighttime behaviors which impact sleep, and they will be able to apply these tools to help manage sleep-related challenges. The purpose of this lesson is to provide patients with a general overview of sleep and outline the importance of quality sleep. Patients will learn about a few of the most common sleep  disorders. Patients will also be introduced to the concept of sleep hygiene, and discover ways to self-manage certain sleeping problems through simple daily behavior changes. Finally, the workshop will motivate patients by clarifying the links between quality sleep and their goals of heart-healthy living.   Recognizing and Reducing Stress Clinical staff led group instruction and group discussion with PowerPoint presentation and patient guidebook. To enhance the learning environment the use of posters, models and videos may be added. At the conclusion of this workshop, patients will be able to understand the types of stress reactions, differentiate between acute and chronic stress, and recognize the impact that chronic stress has on their health. They will also be able to apply different coping mechanisms, such as reframing negative self-talk. Patients will have the opportunity to practice a variety of stress management techniques, such as deep abdominal breathing, progressive muscle relaxation, and/or guided imagery.  The purpose of this lesson is to educate patients on the role of stress in their lives and to provide healthy techniques for coping with it.  Learning Barriers/Preferences:  Learning Barriers/Preferences - 09/14/24 1357       Learning Barriers/Preferences   Learning Barriers Sight    Learning Preferences Computer/Internet;Audio;Group Instruction;Individual Instruction;Pictoral;Skilled Demonstration;Verbal Instruction;Video;Written Material          Education Topics:  Knowledge Questionnaire Score:  Knowledge Questionnaire Score - 09/14/24 1101       Knowledge Questionnaire Score   Pre Score 23/24          Core Components/Risk Factors/Patient Goals at Admission:  Personal Goals and Risk Factors at Admission - 09/14/24 1358       Core Components/Risk Factors/Patient Goals on Admission    Weight Management Yes;Obesity;Weight Loss    Intervention Weight Management:  Develop a combined nutrition and exercise program designed to reach desired caloric intake, while maintaining appropriate intake of nutrient and fiber, sodium and fats, and appropriate energy expenditure required for the weight goal.;Weight Management: Provide education and appropriate resources to help participant work on and attain dietary goals.;Weight Management/Obesity: Establish reasonable short term  and long term weight goals.;Obesity: Provide education and appropriate resources to help participant work on and attain dietary goals.    Admit Weight 278 lb 3.5 oz (126.2 kg)    Goal Weight: Long Term 266 lb (120.7 kg)    Expected Outcomes Short Term: Continue to assess and modify interventions until short term weight is achieved;Long Term: Adherence to nutrition and physical activity/exercise program aimed toward attainment of established weight goal;Weight Loss: Understanding of general recommendations for a balanced deficit meal plan, which promotes 1-2 lb weight loss per week and includes a negative energy balance of [phone] kcal/d;Understanding recommendations for meals to include 15-35% energy as protein, 25-35% energy from fat, 35-60% energy from carbohydrates, less than 200mg  of dietary cholesterol, 20-35 gm of total fiber daily;Understanding of distribution of calorie intake throughout the day with the consumption of 4-5 meals/snacks    Hypertension Yes    Intervention Monitor prescription use compliance.;Provide education on lifestyle modifcations including regular physical activity/exercise, weight management, moderate sodium restriction and increased consumption of fresh fruit, vegetables, and low fat dairy, alcohol moderation, and smoking cessation.    Expected Outcomes Short Term: Continued assessment and intervention until BP is < 140/90mm HG in hypertensive participants. < 130/56mm HG in hypertensive participants with diabetes, heart failure or chronic kidney disease.;Long Term: Maintenance  of blood pressure at goal levels.          Core Components/Risk Factors/Patient Goals Review:   Goals and Risk Factor Review     Row Name 09/28/24 1431 10/05/24 1238 10/27/24 1632         Core Components/Risk Factors/Patient Goals Review   Personal Goals Review Weight Management/Obesity;Hypertension Weight Management/Obesity;Hypertension Weight Management/Obesity;Hypertension     Review Jerry Parrish has been doing well with exercise. Vital signs have been stable. Jerry Parrish has been doing well with exercise. Vital signs have been stable. Jerry Parrish has increased his workloads Jerry Parrish has been doing well with exercise. VSS. Jerry Parrish has increased his workloads     Expected Outcomes Jerry Parrish will continue to participate in cardiac rehab for exercise, nutrition and lifestyle modifications Jerry Parrish will continue to participate in cardiac rehab for exercise, nutrition and lifestyle modifications Jerry Parrish will continue to participate in cardiac rehab for exercise, nutrition and lifestyle modifications        Core Components/Risk Factors/Patient Goals at Discharge (Final Review):   Goals and Risk Factor Review - 10/27/24 1632       Core Components/Risk Factors/Patient Goals Review   Personal Goals Review Weight Management/Obesity;Hypertension    Review Jerry Parrish has been doing well with exercise. VSS. Jerry Parrish has increased his workloads    Expected Outcomes Jerry Parrish will continue to participate in cardiac rehab for exercise, nutrition and lifestyle modifications          ITP Comments:  ITP Comments     Row Name 09/14/24 1057 09/28/24 1412 10/05/24 1230 10/27/24 1628     ITP Comments Dr. Wilbert Bihari medical director. Introduction to pritikin education/intensive cardiac rehab. Initial orientation packet reviewed with patient. 30 Day ITP Review. Jerry Parrish started cardiac rehab on 09/20/24. Jerry Parrish is off to a good start to exercise. 30 Day ITP Review. Jerry Parrish started cardiac rehab on 09/20/24. Jerry Parrish has good attendance and participation with  exercise at cardiac rehab/ 30 Day ITP Review. Jerry Parrish continues to have good attendance and participation with exercise at cardiac rehab       Comments: see ITP comments    [1]  Current Outpatient Medications:    acetaminophen  (TYLENOL ) 325 MG tablet, Take 2 tablets (  650 mg total) by mouth every 6 (six) hours as needed., Disp: , Rfl:    aspirin  EC 81 MG tablet, Take 1 tablet (81 mg total) by mouth daily. Swallow whole., Disp: 90 tablet, Rfl: 1   cyclobenzaprine  (FLEXERIL ) 10 MG tablet, Take 10 mg by mouth 3 (three) times daily as needed for muscle spasms. (Patient not taking: Reported on 08/19/2024), Disp: , Rfl:    metoprolol  tartrate (LOPRESSOR ) 50 MG tablet, Take 1 tablet (50 mg total) by mouth 2 (two) times daily., Disp: 180 tablet, Rfl: 1   omeprazole (PRILOSEC) 20 MG capsule, Take 20 mg by mouth daily. (Patient taking differently: Take 20 mg by mouth daily as needed (PRN).), Disp: , Rfl:    PARoxetine  (PAXIL ) 10 MG tablet, Take 10 mg by mouth every morning., Disp: , Rfl:    warfarin (COUMADIN ) 2.5 MG tablet, Take 1 tablet (2.5 mg total) by mouth at bedtime. Or as instructed by the Coumadin  Clinic., Disp: 90 tablet, Rfl: 6 No current facility-administered medications for this encounter.  Facility-Administered Medications Ordered in Other Encounters:    [facility], Patient & Family Education, , Does not apply, Once, Su, Con RAMAN, MD [2]  Social History Tobacco Use  Smoking Status Never  Smokeless Tobacco Not on file

## 2024-11-03 ENCOUNTER — Encounter (HOSPITAL_COMMUNITY)

## 2024-11-05 ENCOUNTER — Encounter (HOSPITAL_COMMUNITY)

## 2024-11-05 ENCOUNTER — Encounter (HOSPITAL_COMMUNITY): Admission: RE | Admit: 2024-11-05 | Source: Ambulatory Visit

## 2024-11-08 ENCOUNTER — Encounter (HOSPITAL_COMMUNITY)

## 2024-11-10 ENCOUNTER — Encounter (HOSPITAL_COMMUNITY)

## 2024-11-11 ENCOUNTER — Ambulatory Visit: Attending: Cardiology | Admitting: Pharmacist

## 2024-11-11 DIAGNOSIS — Z7901 Long term (current) use of anticoagulants: Secondary | ICD-10-CM | POA: Diagnosis not present

## 2024-11-11 DIAGNOSIS — Z952 Presence of prosthetic heart valve: Secondary | ICD-10-CM | POA: Diagnosis not present

## 2024-11-11 DIAGNOSIS — I359 Nonrheumatic aortic valve disorder, unspecified: Secondary | ICD-10-CM

## 2024-11-11 LAB — POCT INR: INR: 2.1 (ref 2.0–3.0)

## 2024-11-11 NOTE — Progress Notes (Signed)
 Description   INR 2.1: Continue taking warfarin 1 tablet daily except for 1.5 tablets on Mondays. Please call Coumadin  clinic with any medication or diet changes.  Anticoagulation Clinic [phone].  INR in 4 weeks

## 2024-11-11 NOTE — Patient Instructions (Signed)
 Description   INR 2.1: Continue taking warfarin 1 tablet daily except for 1.5 tablets on Mondays. Please call Coumadin  clinic with any medication or diet changes.  Anticoagulation Clinic [phone].  INR in 4 weeks

## 2024-11-12 ENCOUNTER — Encounter (HOSPITAL_COMMUNITY)

## 2024-11-15 ENCOUNTER — Encounter (HOSPITAL_COMMUNITY): Admission: RE | Admit: 2024-11-15 | Source: Ambulatory Visit

## 2024-11-15 ENCOUNTER — Encounter (HOSPITAL_COMMUNITY)

## 2024-11-17 ENCOUNTER — Ambulatory Visit: Attending: Cardiology | Admitting: Cardiology

## 2024-11-17 ENCOUNTER — Encounter: Payer: Self-pay | Admitting: Cardiology

## 2024-11-17 VITALS — BP 136/78 | HR 69 | Ht 71.0 in | Wt 278.0 lb

## 2024-11-17 DIAGNOSIS — J069 Acute upper respiratory infection, unspecified: Secondary | ICD-10-CM | POA: Diagnosis not present

## 2024-11-17 DIAGNOSIS — K219 Gastro-esophageal reflux disease without esophagitis: Secondary | ICD-10-CM | POA: Diagnosis not present

## 2024-11-17 DIAGNOSIS — Z7901 Long term (current) use of anticoagulants: Secondary | ICD-10-CM | POA: Diagnosis not present

## 2024-11-17 DIAGNOSIS — I5032 Chronic diastolic (congestive) heart failure: Secondary | ICD-10-CM | POA: Diagnosis not present

## 2024-11-17 DIAGNOSIS — I10 Essential (primary) hypertension: Secondary | ICD-10-CM | POA: Diagnosis not present

## 2024-11-17 DIAGNOSIS — Z952 Presence of prosthetic heart valve: Secondary | ICD-10-CM

## 2024-11-17 NOTE — Patient Instructions (Addendum)
 Medication Instructions:   No changes *If you need a refill on your cardiac medications before your next appointment, please call your pharmacy*   Lab Work: Not needed    Testing/Procedures: Schedule in Sept 2026 Your physician has requested that you have an echocardiogram. Echocardiography is a painless test that uses sound waves to create images of your heart. It provides your doctor with information about the size and shape of your heart and how well your hearts chambers and valves are working. This procedure takes approximately one hour. There are no restrictions for this procedure. Please do NOT wear cologne, perfume, aftershave, or lotions (deodorant is allowed). Please arrive 15 minutes prior to your appointment time.  Please note: We ask at that you not bring children with you during ultrasound (echo/ vascular) testing. Due to room size and safety concerns, children are not allowed in the ultrasound rooms during exams. Our front office staff cannot provide observation of children in our lobby area while testing is being conducted. An adult accompanying a patient to their appointment will only be allowed in the ultrasound room at the discretion of the ultrasound technician under special circumstances. We apologize for any inconvenience.   Follow-Up: At [facility], you and your health needs are our priority.  As part of our continuing mission to provide you with exceptional heart care, we have created designated Provider Care Teams.  These Care Teams include your primary Cardiologist (physician) and Advanced Practice Providers (APPs -  Physician Assistants and Nurse Practitioners) who all work together to provide you with the care you need, when you need it.     Your next appointment:   8 month(s) after  Echo is completed  The format for your next appointment:   In Person  Provider:   Alm Clay, MD

## 2024-11-17 NOTE — Progress Notes (Signed)
 " Cardiology Office Note:  .   Date:  11/19/2024  ID:  Donnice GORMAN Chang, DOB Dec 08, 1965, MRN [id number] PCP: Kip Righter, MD  [facility] Providers Cardiologist:  Alm Clay, MD     Chief Complaint  Patient presents with   Follow-up    Transfer care to [facility].   Cardiac Valve Problem    Status post SAVR/Bentall September 2025    Patient Profile: SABRA     Jed Kutch Mcnellis is a  59 y.o. male  with a PMH notable for recent SAVR/Aortoplasty who presents here for transition of care to [place] (from [place]) at the request of Kip Righter, MD.  PMH: Severe AS (Bicuspid AoV) with Ascending Ao Aneurysm - AS diagnosed in 20s with SEM -> Dx of Bicuspid AoV ~42.  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 (Ref # [id number] P0, Serial # [id number])      JODY SILAS was last seen on 08/17/2024 by Barnie Hila, NP (in [place]) for initial post-op f/u.  Only real c/o was a low grade fever x 1. Well healing sternotomy wound. Not walking as much as hoped.  Asked to transfer to [place]. He started noting Sx of edema & DOE July 2025 initiating AS workup.   Subjective  Discussed the use of AI scribe software for clinical note transcription with the patient, who gave verbal consent to proceed.  History of Present Illness VRISHANK MOSTER is a 59 year old male with a history of mechanical aortic valve replacement who presents for follow-up regarding his cardiac status.  He has been experiencing a persistent cough that occurs intermittently without fever. He has been evaluated at a walk-in clinic twice, where tests were conducted but no specific cause was identified, and it was attributed to a viral infection. He is currently using an albuterol inhaler a couple of times a day and has previously used cherry cough syrup with codeine. The cough is described as annoying and  sometimes causes a sore throat due to agitation.  He has a history of hypertension, which tends to be elevated during medical appointments but is usually lower at other times. He is currently taking metoprolol  50 mg twice a day and warfarin once a day. His warfarin dosage was adjusted to a pill and a half on Mondays due to low levels detected at the Coumadin  clinic.  He underwent aortic valve replacement surgery on July 24, 2023. Post-surgery, he reports improved breathing and no significant issues such as heart racing, irregular heartbeat, or orthopnea. He occasionally hears the mechanical valve ticking, especially when lying in bed.  He has experienced episodes of GERD in the past and takes omeprazole 3 mg daily. He describes a recent episode of chest discomfort that resolved immediately after taking Tums, which he found surprising due to the rapid relief.  He is participating in cardiac rehabilitation and has 12 sessions remaining. He finds the program beneficial, particularly in regaining confidence in physical activity post-surgery. He notes the financial aspect of continuing the program now that a new year has started and deductibles have reset.  Cardiovascular ROS: no chest pain or dyspnea on exertion positive for - intermittent viral infection issues but no active cardiac symptoms.  GERD symptoms relieved with omeprazole and Tums. negative for - edema, irregular heartbeat, orthopnea, palpitations, paroxysmal nocturnal dyspnea, rapid heart rate, shortness of breath, or lightheadedness, dizziness or wooziness, syncope  or near syncope, TIA or emesis weakness, claudication     Objective   Active Medications[1]   Studies Reviewed: SABRA   EKG Interpretation Date/Time:  Wednesday November 17 2024 08:32:11 EST Ventricular Rate:  57 PR Interval:  158 QRS Duration:  140 QT Interval:  470 QTC Calculation: 457 R Axis:   -28  Text Interpretation: Sinus bradycardia Left ventricular  hypertrophy with QRS widening and repolarization abnormality ( R in aVL , Cornell product ) When compared with ECG of 26-Jul-2024 16:19, Nonspecific T wave abnormality no longer evident in Inferior leads Confirmed by Anner Lenis ([id number]) on 11/17/2024 8:50:38 AM    Lab Results  Component Value Date   NA 138 08/17/2024   K 4.1 08/17/2024   CREATININE 0.97 08/17/2024   EGFR 91 08/17/2024   GLUCOSE 102 (H) 08/17/2024   Lab Results  Component Value Date   WBC 8.6 08/17/2024   HGB 11.4 (L) 08/17/2024   HCT 37.0 (L) 08/17/2024   MCV 84 08/17/2024   PLT 450 08/17/2024   Lab Results  Component Value Date   HGBA1C 4.8 07/22/2024   Labs from [facility] 04/08/2024: TC 167, TG 130, HDL 56, LDL 89.  TSH 1.66  Cardiac studies Post--Op Echocardiogram (October 05, 2024): LVEF 60-65%. No RWMA. Mildly dilated LV with Mod LVH. Mod RA dilation. No AI. -> Mechanical Prosthetic Valve present - well seated & normal functioning prosthesis.  Pre-Op Echocardiogram (07/02/2024): LVEF 55-60% with Gr 1 DD. No RWMA. Mod LA dilation. BiCuspid AoV: mild AI, Severe AS - mean AVG 76 mmHg. MIld-mod MR without MS. Normal RAP, but unable to obtain RVSP.  Intra-Op TEE (91/19/2025): LVEF 55-60%. Severely calcified AoV appeared Tricuspid but Non-coronary cusp mobile calcium => 68 mmHg mean AVG. => post-op, Mech AoV functioning well with Trans-AV gradient 12-14 mmHg.  Pre-Op R&L Heart Cath (07/09/2024): No Angiographically Significant CAD; mildly elevated L&RHC/Pulm Pressures with Low-Normal to mildly reduced FICK CO/CI   Risk Assessment/Calculations:            Physical Exam:   VS:  BP 136/78   Pulse 69   Ht 5' 11 (1.803 m)   Wt 278 lb (126.1 kg)   SpO2 95%   BMI 38.77 kg/m    Wt Readings from Last 3 Encounters:  11/17/24 278 lb (126.1 kg)  09/14/24 278 lb 3.5 oz (126.2 kg)  08/19/24 277 lb (125.6 kg)    GEN: Well nourished, well groomed in no acute distress; moderately obese but otherwise healthy-appearing NECK: No JVD;  No carotid bruits CARDIAC: RRR; normal S1, metallic S2; harsh 2/6 SEM at RUSB.  No rubs or gallops. RESPIRATORY:  Clear to auscultation without rales, wheezing or rhonchi ; nonlabored, good air movement. ABDOMEN: Soft, non-tender, non-distended EXTREMITIES:  No edema; No deformity     ASSESSMENT AND PLAN: .   S/P AVR (aortic valve replacement) and aortoplasty Postoperative echocardiogram showed well-functioning valve with improved pump function. No significant arrhythmias or heart failure symptoms. Mechanical valve expected to last indefinitely. - Continue chronic anticoagulation therapy with warfarin. - Continue beta-blocker for blood pressure and heart rate control. - Schedule follow-up echocardiogram in September 2026. - Will also need intermittent CT scan evaluations to follow ascending aortic aneurysm. - Monitor for symptoms of valve dysfunction. - Continue cardiac rehabilitation if beneficial.  Status post mechanical aortic valve replacement Mechanical valve chosen given his young age with plans for longevity. Postoperative echocardiogram showed well-functioning valve with improved pump function. No significant arrhythmias or heart failure symptoms. Mechanical valve expected  to last indefinitely. - Continue chronic anticoagulation therapy with warfarin. - Schedule follow-up echocardiogram in September 2026. - Discussed SBE prophylaxis for GI procedures and deep dental cleaning. - Continue beta-blocker for blood pressure and heart rate control.  Long term (current) use of anticoagulants Chronic anticoagulation with warfarin is necessary. INR management is crucial. Recent warfarin dose adjustments due to low INR. Discussed need for bridging with Lovenox  for procedures. - Continue warfarin therapy with adjusted dosing as per Coumadin  clinic. - Educated on INR monitoring and dietary considerations. - Plan for potential home INR monitoring. - Ensure communication with healthcare  providers for procedures.  (HFpEF) heart failure with preserved ejection fraction (HCC) Likely secondary to previous aortic valve disease and hypertension. No current symptoms of heart failure exacerbation. - Continue metoprolol  50 mg twice daily for blood pressure and heart rate control.  May need additional blood pressure control if pressures continue to remain elevated.  Today seems to be more related to whitecoat syndrome. - Monitor blood pressure regularly and maintain target range. - Coordinate with PCP for ongoing management of blood pressure and cholesterol.  Primary hypertension Preliminary blood pressure check today was little high but on recheck was better.  Continue to monitor.  He is on metoprolol  tartrate 50 mg twice daily.  Low threshold to consider adding ARB for additional blood pressure control/afterload reduction..  Gastro-esophageal reflux disease without esophagitis GERD managed with omeprazole. Recent severe symptoms resolved with Tums. Cough and sore throat present, likely due to postnasal drip or agitation from coughing. - Continue omeprazole 3 mg daily. - Use Tums as needed for acute symptoms.  URI (upper respiratory infection) Acute viral upper respiratory infection Recent episode with persistent cough. No fever or significant respiratory distress. Cough likely due to postnasal drip or asthma. - Use Robitussin and Tessalon for cough management. - Continue albuterol inhaler as needed.   Orders Placed This Encounter  Procedures   EKG 12-Lead   ECHOCARDIOGRAM COMPLETE         Follow-Up: Return in about 8 months (around 07/18/2025) for 8 month follow-up with me, [employer].  I spent 64 minutes in the care of IZEK CORVINO today including reviewing labs (2 minute), reviewing outside labs from [facility] (included in 2 minutes), reviewing studies (reviewed cath study, 2 echocardiograms, 1 TEE and op note: 15 minutes), face to face time discussing  treatment options (24 minutes), reviewing records from previous clinic visits, hospitalization, postop notes from surgery and APP as well as cardiac rehab notes (10 minutes), 13 minutes dictating, and documenting in the encounter.      Signed, Alm MICAEL Clay, MD, MS Alm Clay, M.D., M.S. Interventional Cardiologist  [facility] Pager # [phone]         [1]  Current Meds  Medication Sig   AIRSUPRA 90-80 MCG/ACT AERO SMARTSIG:2 Puff(s) By Mouth 6 Times Daily PRN   aspirin  EC 81 MG tablet Take 1 tablet (81 mg total) by mouth daily. Swallow whole.   metoprolol  tartrate (LOPRESSOR ) 50 MG tablet Take 1 tablet (50 mg total) by mouth 2 (two) times daily.   omeprazole (PRILOSEC) 20 MG capsule Take 20 mg by mouth daily. (Patient taking differently: Take 20 mg by mouth daily as needed (PRN).)   PARoxetine  (PAXIL ) 10 MG tablet Take 10 mg by mouth every morning.   warfarin (COUMADIN ) 2.5 MG tablet Take 1 tablet (2.5 mg total) by mouth at bedtime. Or as instructed by the Coumadin  Clinic.   "

## 2024-11-19 ENCOUNTER — Encounter (HOSPITAL_COMMUNITY)

## 2024-11-19 ENCOUNTER — Encounter: Payer: Self-pay | Admitting: Cardiology

## 2024-11-19 DIAGNOSIS — Z952 Presence of prosthetic heart valve: Secondary | ICD-10-CM | POA: Insufficient documentation

## 2024-11-19 DIAGNOSIS — J069 Acute upper respiratory infection, unspecified: Secondary | ICD-10-CM | POA: Insufficient documentation

## 2024-11-19 NOTE — Assessment & Plan Note (Signed)
 GERD managed with omeprazole. Recent severe symptoms resolved with Tums. Cough and sore throat present, likely due to postnasal drip or agitation from coughing. - Continue omeprazole 3 mg daily. - Use Tums as needed for acute symptoms.

## 2024-11-19 NOTE — Assessment & Plan Note (Signed)
 Preliminary blood pressure check today was little high but on recheck was better.  Continue to monitor.  He is on metoprolol  tartrate 50 mg twice daily.  Low threshold to consider adding ARB for additional blood pressure control/afterload reduction.SABRA

## 2024-11-19 NOTE — Assessment & Plan Note (Signed)
 Acute viral upper respiratory infection Recent episode with persistent cough. No fever or significant respiratory distress. Cough likely due to postnasal drip or asthma. - Use Robitussin and Tessalon for cough management. - Continue albuterol inhaler as needed.

## 2024-11-19 NOTE — Assessment & Plan Note (Signed)
 Likely secondary to previous aortic valve disease and hypertension. No current symptoms of heart failure exacerbation. - Continue metoprolol  50 mg twice daily for blood pressure and heart rate control.  May need additional blood pressure control if pressures continue to remain elevated.  Today seems to be more related to whitecoat syndrome. - Monitor blood pressure regularly and maintain target range. - Coordinate with PCP for ongoing management of blood pressure and cholesterol.

## 2024-11-19 NOTE — Assessment & Plan Note (Addendum)
 Postoperative echocardiogram showed well-functioning valve with improved pump function. No significant arrhythmias or heart failure symptoms. Mechanical valve expected to last indefinitely. - Continue chronic anticoagulation therapy with warfarin. - Continue beta-blocker for blood pressure and heart rate control. - Schedule follow-up echocardiogram in September 2026. - Will also need intermittent CT scan evaluations to follow ascending aortic aneurysm. - Monitor for symptoms of valve dysfunction. - Continue cardiac rehabilitation if beneficial.

## 2024-11-19 NOTE — Assessment & Plan Note (Addendum)
 Mechanical valve chosen given his young age with plans for longevity. Postoperative echocardiogram showed well-functioning valve with improved pump function. No significant arrhythmias or heart failure symptoms. Mechanical valve expected to last indefinitely. - Continue chronic anticoagulation therapy with warfarin. - Schedule follow-up echocardiogram in September 2026. - Discussed SBE prophylaxis for GI procedures and deep dental cleaning. - Continue beta-blocker for blood pressure and heart rate control.

## 2024-11-19 NOTE — Assessment & Plan Note (Signed)
 Chronic anticoagulation with warfarin is necessary. INR management is crucial. Recent warfarin dose adjustments due to low INR. Discussed need for bridging with Lovenox  for procedures. - Continue warfarin therapy with adjusted dosing as per Coumadin  clinic. - Educated on INR monitoring and dietary considerations. - Plan for potential home INR monitoring. - Ensure communication with healthcare providers for procedures.

## 2024-11-22 ENCOUNTER — Encounter (HOSPITAL_COMMUNITY): Admission: RE | Admit: 2024-11-22

## 2024-11-22 ENCOUNTER — Encounter (HOSPITAL_COMMUNITY)

## 2024-11-22 ENCOUNTER — Telehealth (HOSPITAL_COMMUNITY): Payer: Self-pay

## 2024-11-22 NOTE — Telephone Encounter (Signed)
 Attempted to call patient regarding 8x no call, no shows for 6:45 CR class- no answer, left message to call us  back.

## 2024-11-24 ENCOUNTER — Telehealth (HOSPITAL_COMMUNITY): Payer: Self-pay

## 2024-11-24 ENCOUNTER — Encounter (HOSPITAL_COMMUNITY)

## 2024-11-24 ENCOUNTER — Encounter (HOSPITAL_COMMUNITY): Admission: RE | Admit: 2024-11-24 | Source: Ambulatory Visit

## 2024-11-24 DIAGNOSIS — Z952 Presence of prosthetic heart valve: Secondary | ICD-10-CM | POA: Insufficient documentation

## 2024-11-24 NOTE — Telephone Encounter (Signed)
 Message left on voicemail informing pt we are discharging him from the cardiac rehab program due to lack of attendance.  Appointments taken out of schedule.

## 2024-11-24 NOTE — Telephone Encounter (Signed)
 Called [employer] regarding patient's benefits for 2025 and 2026.  2025 benefits are as confirmed in October: $1,250 deductible  - met, $4,890 - met, no visit limits, no prior authorization required. Spoke with Homer on 11/24/24 @ 11:30am, ref #[id number], and spoke with Odis from prior [facility] team, ref #[id number]. Patient's benefits reset on 11/04/24.  Called patient regarding this information as he thought he was sent a bill for only the education classes of cardiac rehab for 2025. Confirmed his benefits, asked if the letter he had received was a bill or a statement of benefits. Patient is not sure, will check and call us  back tomorrow. Informed patient it should not be a bill, and if it is he needs to reach out to [employer] to have them re-process it as it is a billing error.   Patient asked if he has been discharged from program, stated he has been out sick for the last few weeks which is why he did not attend class. He will decide by tomorrow if he wants to continue with cardiac rehab.

## 2024-11-24 NOTE — Telephone Encounter (Signed)
 2026 insurance benefits for cardiac rehab:  Pt insurance is active and benefits verified through [facility]. Co-pay $0, DED $1,500/$0 met, out of pocket $5,000/$100.57 met, co-insurance 20%. No pre-authorization required. Spoke with Craig ODESSIA Flores on 11/24/24 at 11:30am , REF# [id number] and [id number].  TCR/ICR? ICR Visit(date of service)limitation? No Can multiple codes be used on the same date of service/visit?(IF ITS A LIMIT) N/A  Is this a lifetime maximum or an annual maximum? Annual Has the member used any of these services to date? 18 out of 36 days completed. Is there a time limit (weeks/months) on start of program and/or program completion? No, but patient hard stop date is 3/09.

## 2024-11-26 ENCOUNTER — Encounter (HOSPITAL_COMMUNITY)

## 2024-11-29 ENCOUNTER — Encounter (HOSPITAL_COMMUNITY)

## 2024-12-01 ENCOUNTER — Encounter (HOSPITAL_COMMUNITY)

## 2024-12-03 ENCOUNTER — Encounter (HOSPITAL_COMMUNITY)

## 2024-12-06 ENCOUNTER — Encounter (HOSPITAL_COMMUNITY)

## 2024-12-08 ENCOUNTER — Encounter (HOSPITAL_COMMUNITY)

## 2024-12-09 ENCOUNTER — Ambulatory Visit

## 2024-12-09 DIAGNOSIS — Z7901 Long term (current) use of anticoagulants: Secondary | ICD-10-CM | POA: Diagnosis not present

## 2024-12-09 DIAGNOSIS — Z952 Presence of prosthetic heart valve: Secondary | ICD-10-CM | POA: Diagnosis not present

## 2024-12-09 LAB — POCT INR: INR: 2 (ref 2.0–3.0)

## 2024-12-09 NOTE — Progress Notes (Signed)
 INR 2.0  Continue taking warfarin 1 tablet daily except for 1.5 tablets on Mondays. Please call Coumadin  clinic with any medication or diet changes.  Anticoagulation Clinic [phone].  INR in 6 weeks

## 2024-12-09 NOTE — Patient Instructions (Signed)
 Continue taking warfarin 1 tablet daily except for 1.5 tablets on Mondays. Please call Coumadin  clinic with any medication or diet changes.  Anticoagulation Clinic [phone].  INR in 6 weeks

## 2025-01-20 ENCOUNTER — Ambulatory Visit

## 2025-07-18 ENCOUNTER — Ambulatory Visit (HOSPITAL_COMMUNITY)
